# Patient Record
Sex: Male | Born: 1985
Health system: Southern US, Community
[De-identification: ages and names within clinical notes are randomized; demographics above are authoritative.]

## PROBLEM LIST (undated history)

## (undated) DIAGNOSIS — N200 Calculus of kidney: Secondary | ICD-10-CM

## (undated) DIAGNOSIS — T7840XA Allergy, unspecified, initial encounter: Secondary | ICD-10-CM

## (undated) DIAGNOSIS — F909 Attention-deficit hyperactivity disorder, unspecified type: Secondary | ICD-10-CM

## (undated) HISTORY — PX: DENTAL SURGERY: SHX609

## (undated) HISTORY — DX: Allergy, unspecified, initial encounter: T78.40XA

## (undated) HISTORY — DX: Attention-deficit hyperactivity disorder, unspecified type: F90.9

---

## 2004-11-15 ENCOUNTER — Encounter: Payer: Self-pay | Admitting: Internal Medicine

## 2006-12-29 ENCOUNTER — Ambulatory Visit: Payer: Self-pay | Admitting: Internal Medicine

## 2006-12-29 DIAGNOSIS — F909 Attention-deficit hyperactivity disorder, unspecified type: Secondary | ICD-10-CM

## 2007-01-31 ENCOUNTER — Telehealth: Payer: Self-pay | Admitting: Family Medicine

## 2007-02-26 ENCOUNTER — Ambulatory Visit: Payer: Self-pay | Admitting: Internal Medicine

## 2007-02-26 DIAGNOSIS — J069 Acute upper respiratory infection, unspecified: Secondary | ICD-10-CM | POA: Insufficient documentation

## 2007-03-14 ENCOUNTER — Telehealth (INDEPENDENT_AMBULATORY_CARE_PROVIDER_SITE_OTHER): Payer: Self-pay | Admitting: *Deleted

## 2007-04-24 ENCOUNTER — Telehealth (INDEPENDENT_AMBULATORY_CARE_PROVIDER_SITE_OTHER): Payer: Self-pay | Admitting: *Deleted

## 2007-05-29 ENCOUNTER — Telehealth (INDEPENDENT_AMBULATORY_CARE_PROVIDER_SITE_OTHER): Payer: Self-pay | Admitting: *Deleted

## 2007-06-25 ENCOUNTER — Ambulatory Visit: Payer: Self-pay | Admitting: Internal Medicine

## 2007-08-02 ENCOUNTER — Telehealth (INDEPENDENT_AMBULATORY_CARE_PROVIDER_SITE_OTHER): Payer: Self-pay | Admitting: *Deleted

## 2007-08-31 ENCOUNTER — Telehealth: Payer: Self-pay | Admitting: Family Medicine

## 2007-10-08 ENCOUNTER — Telehealth (INDEPENDENT_AMBULATORY_CARE_PROVIDER_SITE_OTHER): Payer: Self-pay | Admitting: *Deleted

## 2007-11-06 ENCOUNTER — Telehealth: Payer: Self-pay | Admitting: Internal Medicine

## 2007-11-30 ENCOUNTER — Ambulatory Visit: Payer: Self-pay | Admitting: Internal Medicine

## 2007-11-30 DIAGNOSIS — B356 Tinea cruris: Secondary | ICD-10-CM | POA: Insufficient documentation

## 2008-01-08 ENCOUNTER — Telehealth: Payer: Self-pay | Admitting: Internal Medicine

## 2008-02-07 ENCOUNTER — Telehealth: Payer: Self-pay | Admitting: Internal Medicine

## 2008-03-14 ENCOUNTER — Telehealth: Payer: Self-pay | Admitting: Family Medicine

## 2008-04-17 ENCOUNTER — Telehealth: Payer: Self-pay | Admitting: Internal Medicine

## 2008-05-20 ENCOUNTER — Telehealth: Payer: Self-pay | Admitting: Family Medicine

## 2008-06-02 ENCOUNTER — Ambulatory Visit: Payer: Self-pay | Admitting: Internal Medicine

## 2008-07-18 ENCOUNTER — Telehealth: Payer: Self-pay | Admitting: Internal Medicine

## 2008-08-19 ENCOUNTER — Telehealth: Payer: Self-pay | Admitting: Internal Medicine

## 2008-09-10 ENCOUNTER — Telehealth: Payer: Self-pay | Admitting: Internal Medicine

## 2008-10-16 ENCOUNTER — Telehealth: Payer: Self-pay | Admitting: Internal Medicine

## 2008-11-14 ENCOUNTER — Telehealth: Payer: Self-pay | Admitting: Internal Medicine

## 2008-12-03 ENCOUNTER — Ambulatory Visit: Payer: Self-pay | Admitting: Internal Medicine

## 2008-12-15 ENCOUNTER — Telehealth: Payer: Self-pay | Admitting: Family Medicine

## 2009-01-20 ENCOUNTER — Telehealth: Payer: Self-pay | Admitting: Internal Medicine

## 2009-02-23 ENCOUNTER — Telehealth: Payer: Self-pay | Admitting: Internal Medicine

## 2009-03-24 ENCOUNTER — Telehealth: Payer: Self-pay | Admitting: Internal Medicine

## 2009-05-12 ENCOUNTER — Telehealth (INDEPENDENT_AMBULATORY_CARE_PROVIDER_SITE_OTHER): Payer: Self-pay | Admitting: *Deleted

## 2009-05-27 ENCOUNTER — Ambulatory Visit: Payer: Self-pay | Admitting: Internal Medicine

## 2009-05-28 ENCOUNTER — Telehealth: Payer: Self-pay | Admitting: Family Medicine

## 2009-07-23 ENCOUNTER — Telehealth: Payer: Self-pay | Admitting: Internal Medicine

## 2009-08-26 ENCOUNTER — Telehealth: Payer: Self-pay | Admitting: Internal Medicine

## 2009-10-09 ENCOUNTER — Telehealth: Payer: Self-pay | Admitting: Family Medicine

## 2009-11-18 ENCOUNTER — Telehealth: Payer: Self-pay | Admitting: Internal Medicine

## 2009-11-23 ENCOUNTER — Telehealth: Payer: Self-pay | Admitting: Internal Medicine

## 2009-11-26 ENCOUNTER — Ambulatory Visit: Payer: Self-pay | Admitting: Internal Medicine

## 2009-11-26 DIAGNOSIS — J309 Allergic rhinitis, unspecified: Secondary | ICD-10-CM | POA: Insufficient documentation

## 2009-12-15 ENCOUNTER — Telehealth: Payer: Self-pay | Admitting: Internal Medicine

## 2010-02-22 ENCOUNTER — Telehealth: Payer: Self-pay | Admitting: Internal Medicine

## 2010-04-02 ENCOUNTER — Telehealth: Payer: Self-pay | Admitting: Internal Medicine

## 2010-04-07 NOTE — Progress Notes (Signed)
Summary: regarding concerta  Phone Note Call from Patient   Caller: Mom Summary of Call: Mother states pt has been on the same dose of concerta for years.  She is concerned about his behavior- he procrastinates, has other adult ADD problems.  He has appt to see you on 9/22.  Mother said she will tell pt that she called and that he needs to discuss these issues with you. Initial call taken by: Lowella Petties CMA,  November 23, 2009 4:16 PM  Follow-up for Phone Call        okay Unless he gives permission, I really can't discuss things with her Follow-up by: Cindee Salt MD,  November 23, 2009 5:19 PM

## 2010-04-07 NOTE — Progress Notes (Signed)
Summary: Rx Concerta  Phone Note Refill Request Call back at Home Phone [phone] Message from:  Patient on March 24, 2009 2:33 PM  Refills Requested: Medication #1:  CONCERTA 54 MG  TBCR 1 daily as directed. Patient called to request Rx refill, please call when ready for pick up.   Method Requested: Pick up at Office Initial call taken by: Linde Gillis CMA Duncan Dull),  March 24, 2009 2:33 PM  Follow-up for Phone Call        Rx written Follow-up by: Cindee Salt MD,  March 24, 2009 2:51 PM  Additional Follow-up for Phone Call Additional follow up Details #1::        Spoke with patient and advised rx ready for pick-up  Additional Follow-up by: Mervin Hack CMA Duncan Dull),  March 24, 2009 3:40 PM    Prescriptions: CONCERTA 54 MG  TBCR (METHYLPHENIDATE HCL) 1 daily as directed  #30 x 0   Entered and Authorized by:   Cindee Salt MD   Signed by:   Cindee Salt MD on 03/24/2009   Method used:   Print then Give to Patient   RxID:   [id number]

## 2010-04-07 NOTE — Progress Notes (Signed)
Summary: refill request for concerta- wants script today  Phone Note Refill Request Call back at Home Phone [phone] Message from:  Patient  Refills Requested: Medication #1:  CONCERTA 54 MG  TBCR 1 daily as directed. Please call pt when ready, he is asking to get this today.  Initial call taken by: Lowella Petties CMA,  October 09, 2009 3:35 PM  Follow-up for Phone Call        Gave prescription to Cynthia/Donna for pt pick up today if possible.  Follow-up by: Kerby Nora MD,  October 09, 2009 5:02 PM    Prescriptions: CONCERTA 54 MG  TBCR (METHYLPHENIDATE HCL) 1 daily as directed.  #30 x 0   Entered and Authorized by:   Kerby Nora MD   Signed by:   Kerby Nora MD on 10/09/2009   Method used:   Print then Give to Patient   RxID:   [id number]

## 2010-04-07 NOTE — Progress Notes (Signed)
Summary: Concerta  Phone Note Refill Request Call back at Home Phone [phone] Message from:  Patient on August 26, 2009 11:01 AM  Refills Requested: Medication #1:  CONCERTA 54 MG  TBCR 1 daily as directed. Please call patient when prescription is ready for pickup.    Method Requested: Pick up at Office Initial call taken by: Delilah Shan CMA Duncan Dull),  August 26, 2009 11:01 AM  Follow-up for Phone Call        Rx written Follow-up by: Cindee Salt MD,  August 26, 2009 12:32 PM  Additional Follow-up for Phone Call Additional follow up Details #1::        Spoke with patient and advised rx ready for pick-up  Additional Follow-up by: Mervin Hack CMA Duncan Dull),  August 26, 2009 12:58 PM    Prescriptions: CONCERTA 54 MG  TBCR (METHYLPHENIDATE HCL) 1 daily as directed.  #30 x 0   Entered and Authorized by:   Cindee Salt MD   Signed by:   Cindee Salt MD on 08/26/2009   Method used:   Print then Give to Patient   RxID:   [id number]

## 2010-04-07 NOTE — Progress Notes (Signed)
Summary: vomiting  Phone Note Call from Patient   Caller: Mom- Marcelino Duster [phone] Summary of Call: Pt has had vomiting since last night, had diarrhea but that has stopped.  No known fever.  Mom is asking if phenergan suppositories can be called to [facility].  Advised small sips of clear fluids, rest. Initial call taken by: Lowella Petties CMA,  May 28, 2009 12:14 PM  Follow-up for Phone Call        Yes. Follow-up by: Ruthe Mannan MD,  May 28, 2009 12:17 PM    New/Updated Medications: PROMETHAZINE HCL 12.5 MG  SUPP (PROMETHAZINE HCL) 1 suppository per rectum every six hours as needed nausea. Prescriptions: PROMETHAZINE HCL 12.5 MG  SUPP (PROMETHAZINE HCL) 1 suppository per rectum every six hours as needed nausea.  #20 x 0   Entered and Authorized by:   Ruthe Mannan MD   Signed by:   Ruthe Mannan MD on 05/28/2009   Method used:   Electronically to        [id number] (ret[facility])       [address]       [place], [place]  [address]       Ph: [phone]       Fax: [phone]   RxID:   [id number]   Appended Document: vomiting Wife advised.

## 2010-04-07 NOTE — Progress Notes (Signed)
Summary: refill request for concerta  Phone Note Refill Request Call back at [phone] Message from:  mother  Refills Requested: Medication #1:  CONCERTA 54 MG  TBCR 1 daily as directed. Please call mother when ready.  Initial call taken by: Lowella Petties CMA,  November 18, 2009 10:47 AM  Follow-up for Phone Call        Rx written Follow-up by: Cindee Salt MD,  November 18, 2009 2:12 PM  Additional Follow-up for Phone Call Additional follow up Details #1::        spoke with parent and advised results.  Additional Follow-up by: Mervin Hack CMA Duncan Dull),  November 18, 2009 2:58 PM    New/Updated Medications: CONCERTA 54 MG  TBCR (METHYLPHENIDATE HCL) 1 daily as directed. Prescriptions: CONCERTA 54 MG  TBCR (METHYLPHENIDATE HCL) 1 daily as directed.  #30 x 0   Entered and Authorized by:   Cindee Salt MD   Signed by:   Cindee Salt MD on 11/18/2009   Method used:   Print then Give to Patient   RxID:   [id number]

## 2010-04-07 NOTE — Progress Notes (Signed)
Summary: concerta  Phone Note Refill Request Call back at Home Phone [phone]   Refills Requested: Medication #1:  CONCERTA 54 MG  TBCR 1 daily as directed.  Method Requested: Pick up at Office Initial call taken by: Melody Comas,  Jul 23, 2009 2:21 PM  Follow-up for Phone Call        Rx written Follow-up by: Cindee Salt MD,  Jul 23, 2009 3:47 PM  Additional Follow-up for Phone Call Additional follow up Details #1::        Spoke with patient and advised rx ready for pick-up  Additional Follow-up by: Mervin Hack CMA Duncan Dull),  Jul 23, 2009 4:15 PM    Prescriptions: CONCERTA 54 MG  TBCR (METHYLPHENIDATE HCL) 1 daily as directed.  #30 x 0   Entered and Authorized by:   Cindee Salt MD   Signed by:   Cindee Salt MD on 07/23/2009   Method used:   Print then Give to Patient   RxID:   [id number]

## 2010-04-07 NOTE — Assessment & Plan Note (Signed)
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  Past Medical History: ADHD--inattentive Allergic rhinitis--spring mostly  Past Surgical History: Reviewed history from 12/29/2006 and no changes required. root canals and capping--toddler then  Implants 2006  Family History: Reviewed history from 12/29/2006 and no changes required. 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 Eyes:  Denies double vision and vision loss-1 eye. ENT:  Denies decreased hearing and ringing in ears; teeth okay--sees dentist. CV:  Complains of chest pain or discomfort; denies difficulty breathing at night, difficulty breathing while lying down, fainting, lightheadness, palpitations, and shortness of breath  with exertion; woke with sensation that he couldn't take a full breath about 1 month ago. Tight over lower sternum. Drank some water and moved bowels--got better with drinking. Marland Kitchen Resp:  Denies cough and shortness of breath. GI:  Complains of indigestion; denies abdominal pain, bloody stools, change in bowel habits, and dark tarry stools; heartburn in the past--none recently. GU:  Denies erectile dysfunction; discussed safe sex HIV negative about 1 year ago. MS:  Denies joint pain and joint swelling. Derm:  Denies lesion(s) and rash. Neuro:  Denies numbness, tingling, and weakness. Psych:  Complains of depression; denies anxiety; brief depression when he lost job. Heme:  Denies abnormal bruising and enlarge lymph nodes. Allergy:  Complains of seasonal allergies and sneezing; uses OTC as needed .  Physical Exam  General:  alert and normal appearance.   Eyes:  pupils equal, pupils round, pupils reactive to light, and no optic disk abnormalities.   Ears:  R ear normal and L ear normal.   Mouth:  no erythema, no exudates, and no lesions.   Neck:  supple, no masses, no thyromegaly, no carotid bruits, and no cervical lymphadenopathy.   Lungs:  normal respiratory effort, no intercostal retractions, no accessory muscle use, and normal breath sounds.   Heart:  normal rate, regular rhythm, no murmur, and no gallop.   Abdomen:  soft, non-tender, and no masses.   Genitalia:  no scrotal masses, no testicular masses or atrophy, no cutaneous lesions, and no urethral discharge.   Msk:  no joint tenderness and no  joint swelling.   Pulses:  normal in feet Extremities:  no edema Neurologic:  alert & oriented X3, strength normal in all extremities, and gait normal.   Skin:  no rashes and no suspicious lesions.   Axillary Nodes:  No palpable lymphadenopathy Psych:  normally interactive, good eye contact, not anxious appearing, and not depressed appearing.     Impression & Recommendations:  Problem # 1:   PREVENTIVE HEALTH CARE (ICD-V70.0) Assessment Comment Only healthy discussed fitness, safe sex, etc  Problem # 2:  ATTENTION DEFICIT DISORDER, INATTENTIVE TYPE (ICD-314.00) Assessment: Deteriorated has been having worse problems and wearing off in afternoon will try increasing to 72mg  daily  Problem # 3:  ALLERGIC RHINITIS (ICD-477.9) Assessment: Comment Only discussed OTC meds asked about testing--doesn't seem necessary now  Complete Medication List: 1)  Methylphenidate Hcl 36 Mg Cr-tabs (Methylphenidate hcl) .... 2 tabs daily for inattention  Patient Instructions: 1)  Please schedule a follow-up appointment in 6 months .  Prescriptions: METHYLPHENIDATE HCL 36 MG CR-TABS (METHYLPHENIDATE HCL) 2 tabs daily for inattention  #60 x 0   Entered and Authorized by:   Cindee Salt MD   Signed by:   Cindee Salt MD on 11/26/2009   Method used:   Print then Give to Patient   RxID:   [id number]   Current Allergies (reviewed today): No known allergies

## 2010-04-07 NOTE — Progress Notes (Signed)
Summary: regarding concerta  Phone Note Call from Patient Call back at Home Phone [phone]   Caller: Patient Summary of Call: Pt is asking if he can have another script for concerta, he wants to take 36 mg in the morning and 18 mg around lunchtime, or if there is another medicine that he can try.  He thinks the two 30 mg's that he takes in the morning is too much.  It has helped with his attention span but making him too aggressive. Initial call taken by: Lowella Petties CMA,  December 15, 2009 3:50 PM  Follow-up for Phone Call        Okay to change the 36mg  to once daily and then add 18mg  on days he wants more  If this works out, we can just go back to 54mg  tabs after these run out Follow-up by: Cindee Salt MD,  December 16, 2009 2:12 PM  Additional Follow-up for Phone Call Additional follow up Details #1::        Spoke with patient and advised rx ready for pick-up and advised new instructions. Additional Follow-up by: Mervin Hack CMA Duncan Dull),  December 17, 2009 1:54 PM    New/Updated Medications: METHYLPHENIDATE HCL 36 MG CR-TABS (METHYLPHENIDATE HCL) 1 tabs daily for inattention METHYLPHENIDATE HCL 18 MG CR-TABS (METHYLPHENIDATE HCL) 1 tab daily as directed Prescriptions: METHYLPHENIDATE HCL 18 MG CR-TABS (METHYLPHENIDATE HCL) 1 tab daily as directed  #30 x 0   Entered and Authorized by:   Cindee Salt MD   Signed by:   Cindee Salt MD on 12/16/2009   Method used:   Print then Give to Patient   RxID:   [id number]

## 2010-04-07 NOTE — Assessment & Plan Note (Signed)
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  Past Medical History: Reviewed history from 06/25/2007 and no changes required. ADHD--inattentive  Past Surgical History: Reviewed history from 12/29/2006 and no changes required. root canals and capping--toddler then  Implants 2006  Family History: Reviewed history from 12/29/2006 and no changes required. Parents in good health 1 younger sister Mat GM with CAD HTN on Mom's side DM in mat GF Cancer on mom's side--not sure what type  Social History: Reviewed history from 06/25/2007 and no changes required. Occupation: Licensed conveyancer Part time at [employer] Single Never Smoked Alcohol use--rare No exercise lately  Review of Systems       weight fairly stable tries to exercise when he can--weekly basketball, softball No mood problems---no sig anxiety or depression  Physical Exam  General:  alert and normal appearance.   Psych:  normally interactive, good eye contact, not anxious appearing, and not depressed appearing.     Impression & Recommendations:  Problem # 1:  ATTENTION DEFICIT DISORDER, INATTENTIVE TYPE (ICD-314.00) Assessment  Unchanged doing well no problems with the medicine no dosage changes needed no sig side effects  Complete Medication List: 1)  Concerta 54 Mg Tbcr (Methylphenidate hcl) .Marland Kitchen.. 1 daily as directed.  Patient Instructions: 1)  Please schedule a follow-up appointment in 6 months for physical Prescriptions: CONCERTA 54 MG  TBCR (METHYLPHENIDATE HCL) 1 daily as directed. Please fill in about 2 weeks  #30 x 0   Entered and Authorized by:   Cindee Salt MD   Signed by:   Cindee Salt MD on 05/27/2009   Method used:   Print then Give to Patient   RxID:   [id number]   Current Allergies (reviewed today): No known allergies

## 2010-04-07 NOTE — Progress Notes (Signed)
Summary: refill request for concerta  Phone Note Refill Request Call back at Home Phone [phone] Message from:  Patient  Refills Requested: Medication #1:  CONCERTA 54 MG  TBCR 1 daily as directed. Please call pt when ready.  Initial call taken by: Lowella Petties CMA,  May 12, 2009 12:04 PM  Follow-up for Phone Call        Patient advised.Consuello Masse CMA  Follow-up by: Benny Lennert CMA Duncan Dull),  May 12, 2009 3:02 PM    Prescriptions: CONCERTA 54 MG  TBCR (METHYLPHENIDATE HCL) 1 daily as directed  #30 x 0   Entered and Authorized by:   Hannah Beat MD   Signed by:   Hannah Beat MD on 05/12/2009   Method used:   Print then Give to Patient   RxID:   [id number]

## 2010-04-08 NOTE — Progress Notes (Signed)
Summary: methylphenidate   Phone Note Refill Request Call back at Home Phone [phone] Message from:  Patient on February 22, 2010 10:58 AM  Refills Requested: Medication #1:  METHYLPHENIDATE HCL 36 MG CR-TABS 1 tabs daily for inattention  Medication #2:  METHYLPHENIDATE HCL 18 MG CR-TABS 1 tab daily as directed.  Patient is asking for 54 mg tabs.    Method Requested: Pick up at Office Initial call taken by: Melody Comas,  February 22, 2010 10:59 AM  Follow-up for Phone Call        Rx written Follow-up by: Cindee Salt MD,  February 22, 2010 11:44 AM  Additional Follow-up for Phone Call Additional follow up Details #1::        Patient notified that rx is up front and ready for pickup. Additional Follow-up by: Sydell Axon LPN,  February 22, 2010 12:09 PM    New/Updated Medications: METHYLPHENIDATE HCL 54 MG CR-TABS (METHYLPHENIDATE HCL) 1 tab daily as directed for inattention Prescriptions: METHYLPHENIDATE HCL 54 MG CR-TABS (METHYLPHENIDATE HCL) 1 tab daily as directed for inattention  #30 x 0   Entered and Authorized by:   Cindee Salt MD   Signed by:   Cindee Salt MD on 02/22/2010   Method used:   Print then Give to Patient   RxID:   [id number]

## 2010-04-08 NOTE — Progress Notes (Signed)
Summary: concerta  Phone Note Refill Request Call back at Home Phone [phone] Message from:  Fax from Pharmacy on April 02, 2010 2:05 PM  Refills Requested: Medication #1:  METHYLPHENIDATE HCL 54 MG CR-TABS 1 tab daily as directed for inattention.  Method Requested: Pick up at Office Initial call taken by: Melody Comas,  April 02, 2010 2:05 PM  Follow-up for Phone Call        Rx written Follow-up by: Cindee Salt MD,  April 02, 2010 4:03 PM  Additional Follow-up for Phone Call Additional follow up Details #1::        Spoke with patient and advised rx ready for pick-up  Additional Follow-up by: Mervin Hack CMA Duncan Dull),  April 02, 2010 4:23 PM    Prescriptions: METHYLPHENIDATE HCL 54 MG CR-TABS (METHYLPHENIDATE HCL) 1 tab daily as directed for inattention  #30 x 0   Entered and Authorized by:   Cindee Salt MD   Signed by:   Cindee Salt MD on 04/02/2010   Method used:   Print then Give to Patient   RxID:   [id number]

## 2010-04-14 ENCOUNTER — Encounter: Payer: Self-pay | Admitting: Internal Medicine

## 2010-05-11 ENCOUNTER — Telehealth: Payer: Self-pay | Admitting: Family Medicine

## 2010-05-18 NOTE — Progress Notes (Signed)
Summary: refill request for concerta  Phone Note Refill Request Call back at Home Phone [phone] Message from:  Patient  Refills Requested: Medication #1:  METHYLPHENIDATE HCL 54 MG CR-TABS 1 tab daily as directed for inattention. Pt is requesting 72 mg's, which he was to have increased to after his physical in september.  He says he had some 72 mg's but ran out and since then we have been giving him 54 mg scripts.  Please call when ready.  Initial call taken by: Lowella Petties CMA, AAMA,  May 11, 2010 3:54 PM  Follow-up for Phone Call        I see where that change was made in note  printed in put in nurse in box for pickup  Follow-up by: Judith Part MD,  May 11, 2010 4:45 PM  Additional Follow-up for Phone Call Additional follow up Details #1::        Left message for patient to call back. Prescription left at front desk. Lewanda Rife LPN  May 11, 4130 4:58 PM   Pt did not call back but Zella Ball at front desk said pt picked up rx.Lewanda Rife LPN  May 12, 4399 3:14 PM     New/Updated Medications: METHYLPHENIDATE HCL 36 MG CR-TABS (METHYLPHENIDATE HCL) 2 by mouth once daily for inattention Prescriptions: METHYLPHENIDATE HCL 36 MG CR-TABS (METHYLPHENIDATE HCL) 2 by mouth once daily for inattention  #60 x 0   Entered and Authorized by:   Judith Part MD   Signed by:   Judith Part MD on 05/11/2010   Method used:   Print then Give to Patient   RxID:   [id number]

## 2010-06-01 ENCOUNTER — Ambulatory Visit: Payer: Self-pay | Admitting: Internal Medicine

## 2010-06-15 ENCOUNTER — Other Ambulatory Visit: Payer: Self-pay | Admitting: *Deleted

## 2010-06-15 NOTE — Telephone Encounter (Signed)
Patient is requesting a written Rx for Concerta.  Please advise.

## 2010-06-16 MED ORDER — METHYLPHENIDATE HCL ER (OSM) 36 MG PO TBCR: EXTENDED_RELEASE_TABLET | ORAL | Status: DC

## 2010-06-16 NOTE — Telephone Encounter (Signed)
Left message on machine that rx ready for pick-up 

## 2010-06-18 ENCOUNTER — Encounter: Payer: Self-pay | Admitting: Internal Medicine

## 2010-06-18 ENCOUNTER — Ambulatory Visit (INDEPENDENT_AMBULATORY_CARE_PROVIDER_SITE_OTHER): Payer: Self-pay | Admitting: Internal Medicine

## 2010-06-18 VITALS — BP 137/74 | HR 100 | Temp 98.2°F | Ht 71.0 in | Wt 193.0 lb

## 2010-06-18 DIAGNOSIS — F988 Other specified behavioral and emotional disorders with onset usually occurring in childhood and adolescence: Secondary | ICD-10-CM

## 2010-06-18 NOTE — Progress Notes (Signed)
  Subjective:    Patient ID: Billy Patterson, male    DOB: 03-18-1985, 25 y.o.   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 Allergy   . ADHD (attention deficit hyperactivity disorder)     No past surgical history on file.  Family History  Problem Relation Age of Onset  . Heart disease Maternal Grandmother     cad  . Diabetes Maternal Grandfather     History   Social History  . Marital Status: Single    Spouse Name: N/A    Number of Children: N/A  . Years of Education: N/A   Occupational History  . Not on file.   Social History Main Topics  . Smoking status: Never Smoker   . Smokeless tobacco: Not on file  . Alcohol Use: Yes  . Drug Use: No  . Sexually Active:    Other Topics Concern  . Not on file   Social History Narrative  . No narrative on file   Review of Systems Appetite is okay Weight fluctuates some Uses med on Sunday for church--only occ skips a day Sleeps okay--some trouble lately due to sinuses--is taking allegra and zyrtec     Objective:   Physical Exam  Constitutional: He appears well-developed and well-nourished. No distress.  Psychiatric: He has a normal mood and affect. His behavior is normal. Judgment and thought content normal.          Assessment & Plan:

## 2010-07-22 ENCOUNTER — Other Ambulatory Visit: Payer: Self-pay | Admitting: *Deleted

## 2010-07-22 MED ORDER — METHYLPHENIDATE HCL ER (OSM) 36 MG PO TBCR: EXTENDED_RELEASE_TABLET | ORAL | Status: DC

## 2010-07-22 NOTE — Telephone Encounter (Signed)
Spoke with patient and advised results   

## 2010-08-27 ENCOUNTER — Other Ambulatory Visit: Payer: Self-pay | Admitting: *Deleted

## 2010-08-27 MED ORDER — METHYLPHENIDATE HCL ER (OSM) 36 MG PO TBCR: EXTENDED_RELEASE_TABLET | ORAL | Status: DC

## 2010-08-27 NOTE — Telephone Encounter (Signed)
Spoke with patient and advised results   

## 2010-08-27 NOTE — Telephone Encounter (Signed)
Please call pt when ready.

## 2010-10-05 ENCOUNTER — Other Ambulatory Visit: Payer: Self-pay | Admitting: *Deleted

## 2010-10-05 MED ORDER — METHYLPHENIDATE HCL ER (OSM) 36 MG PO TBCR: EXTENDED_RELEASE_TABLET | ORAL | Status: DC

## 2010-10-05 NOTE — Telephone Encounter (Signed)
Patient called and requested a refill on his Concerta. Please let patient know when it is ready for pickup.

## 2010-10-05 NOTE — Telephone Encounter (Signed)
Patient advised Rx ready for pick up will be left at front desk. 

## 2010-11-10 ENCOUNTER — Other Ambulatory Visit: Payer: Self-pay | Admitting: *Deleted

## 2010-11-10 MED ORDER — METHYLPHENIDATE HCL ER (OSM) 36 MG PO TBCR: EXTENDED_RELEASE_TABLET | ORAL | Status: DC

## 2010-11-10 NOTE — Telephone Encounter (Signed)
Left message on machine that rx is ready for pick-up, and it will be at our front desk.  

## 2010-11-10 NOTE — Telephone Encounter (Signed)
Please call pt when ready.

## 2010-12-16 ENCOUNTER — Other Ambulatory Visit: Payer: Self-pay | Admitting: *Deleted

## 2010-12-16 MED ORDER — METHYLPHENIDATE HCL ER (OSM) 36 MG PO TBCR: EXTENDED_RELEASE_TABLET | ORAL | Status: DC

## 2010-12-16 NOTE — Telephone Encounter (Signed)
Please call pt when ready.

## 2010-12-17 ENCOUNTER — Telehealth: Payer: Self-pay | Admitting: Internal Medicine

## 2010-12-17 NOTE — Telephone Encounter (Signed)
Spoke with patient and informed him that prescription is ready for him to pick up at the office.

## 2010-12-17 NOTE — Telephone Encounter (Signed)
Jamesetta So spoke with patient and advised results

## 2010-12-24 ENCOUNTER — Ambulatory Visit (INDEPENDENT_AMBULATORY_CARE_PROVIDER_SITE_OTHER): Payer: Self-pay | Admitting: Internal Medicine

## 2010-12-24 DIAGNOSIS — Z0289 Encounter for other administrative examinations: Secondary | ICD-10-CM

## 2010-12-24 DIAGNOSIS — F988 Other specified behavioral and emotional disorders with onset usually occurring in childhood and adolescence: Secondary | ICD-10-CM

## 2010-12-25 NOTE — Progress Notes (Signed)
  Subjective:    Patient ID: Billy Patterson, male    DOB: April 19, 1985, 25 y.o.   MRN: [id number]  HPI No show for appointment   Review of Systems     Objective:   Physical Exam        Assessment & Plan:

## 2011-01-20 ENCOUNTER — Other Ambulatory Visit: Payer: Self-pay

## 2011-01-20 NOTE — Telephone Encounter (Signed)
Pt request written rx for Concerta 36 mg. Please call pt at [phone] when rx ready for pick up.

## 2011-01-21 MED ORDER — METHYLPHENIDATE HCL ER (OSM) 36 MG PO TBCR: EXTENDED_RELEASE_TABLET | ORAL | Status: DC

## 2011-01-21 NOTE — Telephone Encounter (Signed)
Spoke with patient and advised results Also spoke with patient about making appt, he will schedule when he picks up rx

## 2011-01-21 NOTE — Telephone Encounter (Signed)
He missed his last appt Make sure he reschedules before his next request Due now

## 2011-01-24 ENCOUNTER — Encounter: Payer: Self-pay | Admitting: Internal Medicine

## 2011-01-24 ENCOUNTER — Ambulatory Visit (INDEPENDENT_AMBULATORY_CARE_PROVIDER_SITE_OTHER): Payer: PRIVATE HEALTH INSURANCE | Admitting: Internal Medicine

## 2011-01-24 DIAGNOSIS — F988 Other specified behavioral and emotional disorders with onset usually occurring in childhood and adolescence: Secondary | ICD-10-CM

## 2011-01-24 NOTE — Progress Notes (Signed)
  Subjective:    Patient ID: Billy Patterson, male    DOB: 1985/05/01, 25 y.o.   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 methylphenidate (CONCERTA) 36 MG CR tablet Take two by mouth once daily  60 tablet  0    No Known Allergies  Past Medical History  Diagnosis Date  . Allergy   . ADHD (attention deficit hyperactivity disorder)     No past surgical history on file.  Family History  Problem Relation Age of Onset  . Heart disease Maternal Grandmother     cad  . Diabetes Maternal Grandfather     History   Social History  . Marital Status: Single    Spouse Name: N/A    Number of Children: N/A  . Years of Education: N/A   Occupational History  . Not on file.   Social History Main Topics  . Smoking status: Never Smoker   . Smokeless tobacco: Never Used  . Alcohol Use: Yes  . Drug Use: No  . Sexually Active:    Other Topics Concern  . Not on file   Social History Narrative  . No narrative on file   Review of Systems No N/V No mood problems that he has noted---generally satisfied    Objective:   Physical Exam  Constitutional: He appears well-developed and well-nourished. No distress.  Psychiatric: He has a normal mood and affect. His behavior is normal. Judgment and thought content normal.          Assessment & Plan:

## 2011-01-24 NOTE — Assessment & Plan Note (Signed)
Doing well Still needs the medicine about daily Has school and work still  Will continue

## 2011-02-22 ENCOUNTER — Other Ambulatory Visit: Payer: Self-pay | Admitting: Internal Medicine

## 2011-02-23 MED ORDER — METHYLPHENIDATE HCL ER (OSM) 36 MG PO TBCR: EXTENDED_RELEASE_TABLET | ORAL | Status: DC

## 2011-02-23 NOTE — Telephone Encounter (Signed)
Left message on machine that rx is ready for pick-up, and it will be at our front desk.  

## 2011-03-28 ENCOUNTER — Other Ambulatory Visit: Payer: Self-pay | Admitting: *Deleted

## 2011-03-28 MED ORDER — METHYLPHENIDATE HCL ER (OSM) 36 MG PO TBCR: EXTENDED_RELEASE_TABLET | ORAL | Status: DC

## 2011-03-29 NOTE — Telephone Encounter (Signed)
Left message on machine that rx is ready for pick-up, and it will be at our front desk.  

## 2011-05-03 ENCOUNTER — Other Ambulatory Visit: Payer: Self-pay | Admitting: Internal Medicine

## 2011-05-03 MED ORDER — METHYLPHENIDATE HCL ER (OSM) 36 MG PO TBCR: EXTENDED_RELEASE_TABLET | ORAL | Status: DC

## 2011-05-03 NOTE — Telephone Encounter (Signed)
Spoke with patient and advised rx ready for pick-up and it will be at the front desk.  

## 2011-05-03 NOTE — Telephone Encounter (Signed)
Triage Record Num: [id number] Operator: Jeraldine Loots Patient Name: Billy Patterson Call Date & Time: 05/03/2011 9:18:18AM Patient Phone: [phone] PCP: Tillman Abide Patient Gender: Male PCP Fax : [phone] Patient DOB: 03/03/1986 Practice Name: [facility] Reason for Call: Caller: Lenorris/Patient; PCP: Tillman Abide I.; CB#: [phone]; Needs a new script for his Concerta 72mg . ** He is completely out of the medication. ** Protocol(s) Used: Office Note Recommended Outcome per Protocol: Information Noted and Sent to Office Reason for Outcome: Caller information to office Care Advice: ~ 05/03/2011 9:21:14AM Page 1 of 1 [id number]

## 2011-06-07 ENCOUNTER — Other Ambulatory Visit: Payer: Self-pay

## 2011-06-07 MED ORDER — METHYLPHENIDATE HCL ER (OSM) 36 MG PO TBCR: EXTENDED_RELEASE_TABLET | ORAL | Status: DC

## 2011-06-07 NOTE — Telephone Encounter (Signed)
Spoke with Barnet Glasgow and advised rx ready for pick-up and it will be at the front desk.

## 2011-06-07 NOTE — Telephone Encounter (Signed)
Billy Patterson called requesting written rx for Concerta 36 mg. Pt is out of med and pt is in school and cannot be reached and please call Gurkirat Basher [phone] when rx ready for pick up. Pt  saw Dr Alphonsus Sias 01/24/11.

## 2011-06-13 ENCOUNTER — Emergency Department: Payer: Self-pay | Admitting: Emergency Medicine

## 2011-07-07 ENCOUNTER — Other Ambulatory Visit: Payer: Self-pay

## 2011-07-07 MED ORDER — METHYLPHENIDATE HCL ER (OSM) 36 MG PO TBCR: EXTENDED_RELEASE_TABLET | ORAL | Status: DC

## 2011-07-07 NOTE — Telephone Encounter (Signed)
Left message on machine that rx is ready for pick-up, and it will be at our front desk.  

## 2011-07-07 NOTE — Telephone Encounter (Signed)
Pt request written rx Concerta 36 mg. Pt last seen 01/24/11. When rx ready for pick up call [phone].

## 2011-07-28 ENCOUNTER — Encounter: Payer: PRIVATE HEALTH INSURANCE | Admitting: Internal Medicine

## 2011-07-28 DIAGNOSIS — Z0289 Encounter for other administrative examinations: Secondary | ICD-10-CM

## 2013-05-30 IMAGING — CR RIGHT HAND - COMPLETE 3+ VIEW
1 series · 3 of 3 positions shown · non-contrast
Comparison: none

REASON FOR EXAM: punched glass door, 3rd MCP abrasion with glass in joint
COMMENTS:

PROCEDURE:     DXR - DXR HAND RT COMPLETE W/OBLIQUES  - June 13, 2011  [DATE]
RESULT:     No fracture, dislocation or other acute bony abnormality is
identified. No radiodense soft tissue foreign body is seen.

[Series 1: x hand pa right · 0.14mm/px · 3 of 3 slices shown]
[im 1/3]
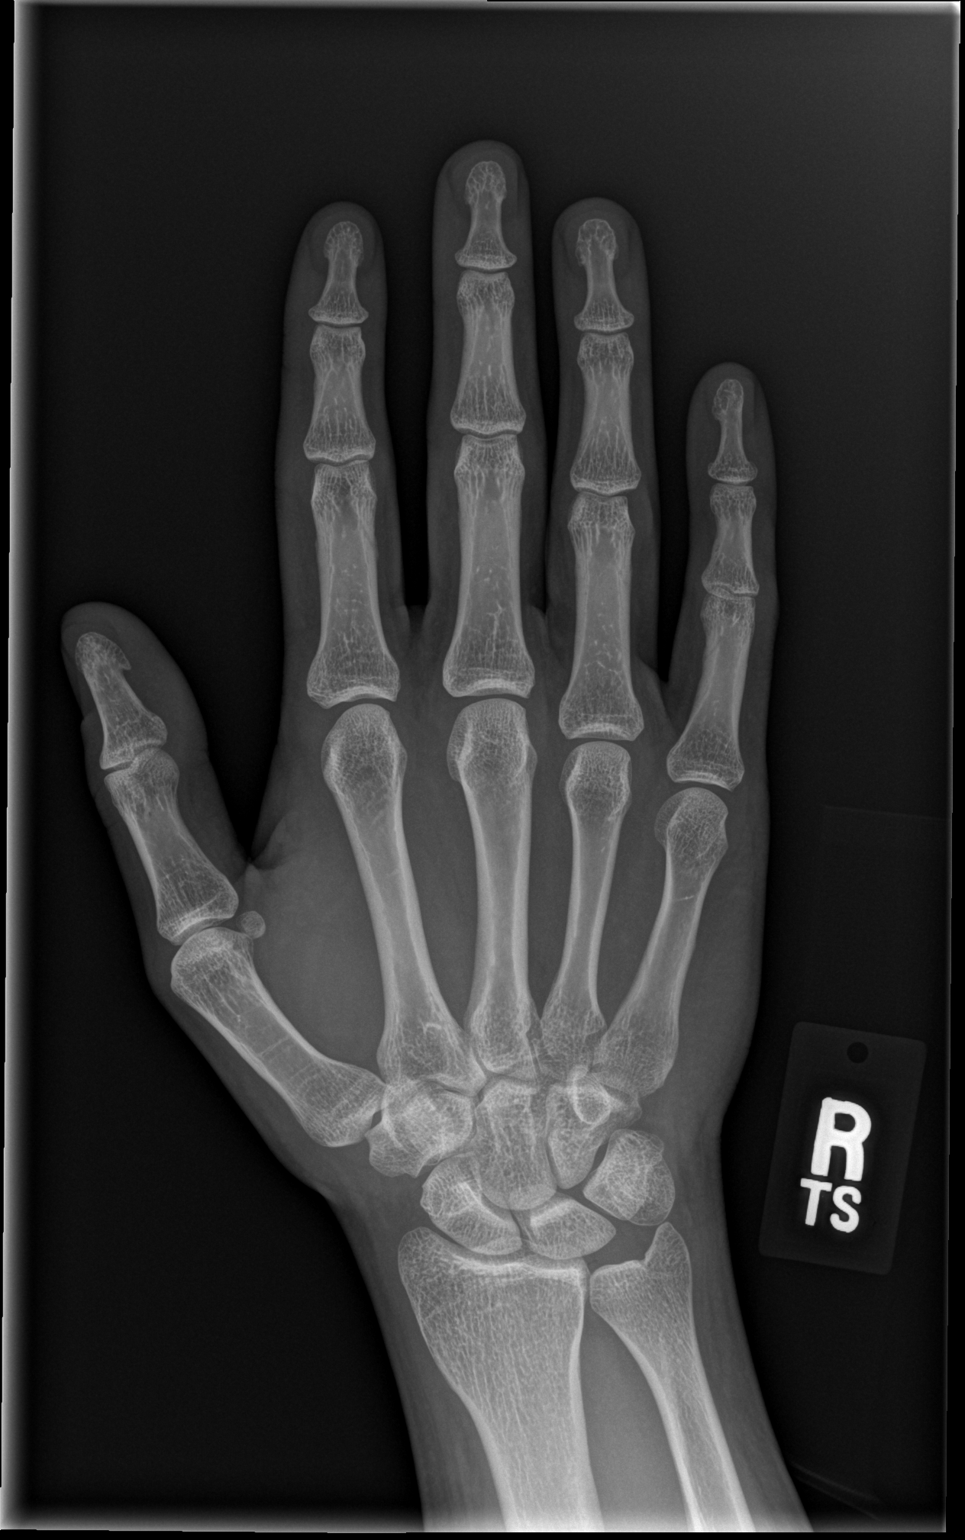
[im 2/3]
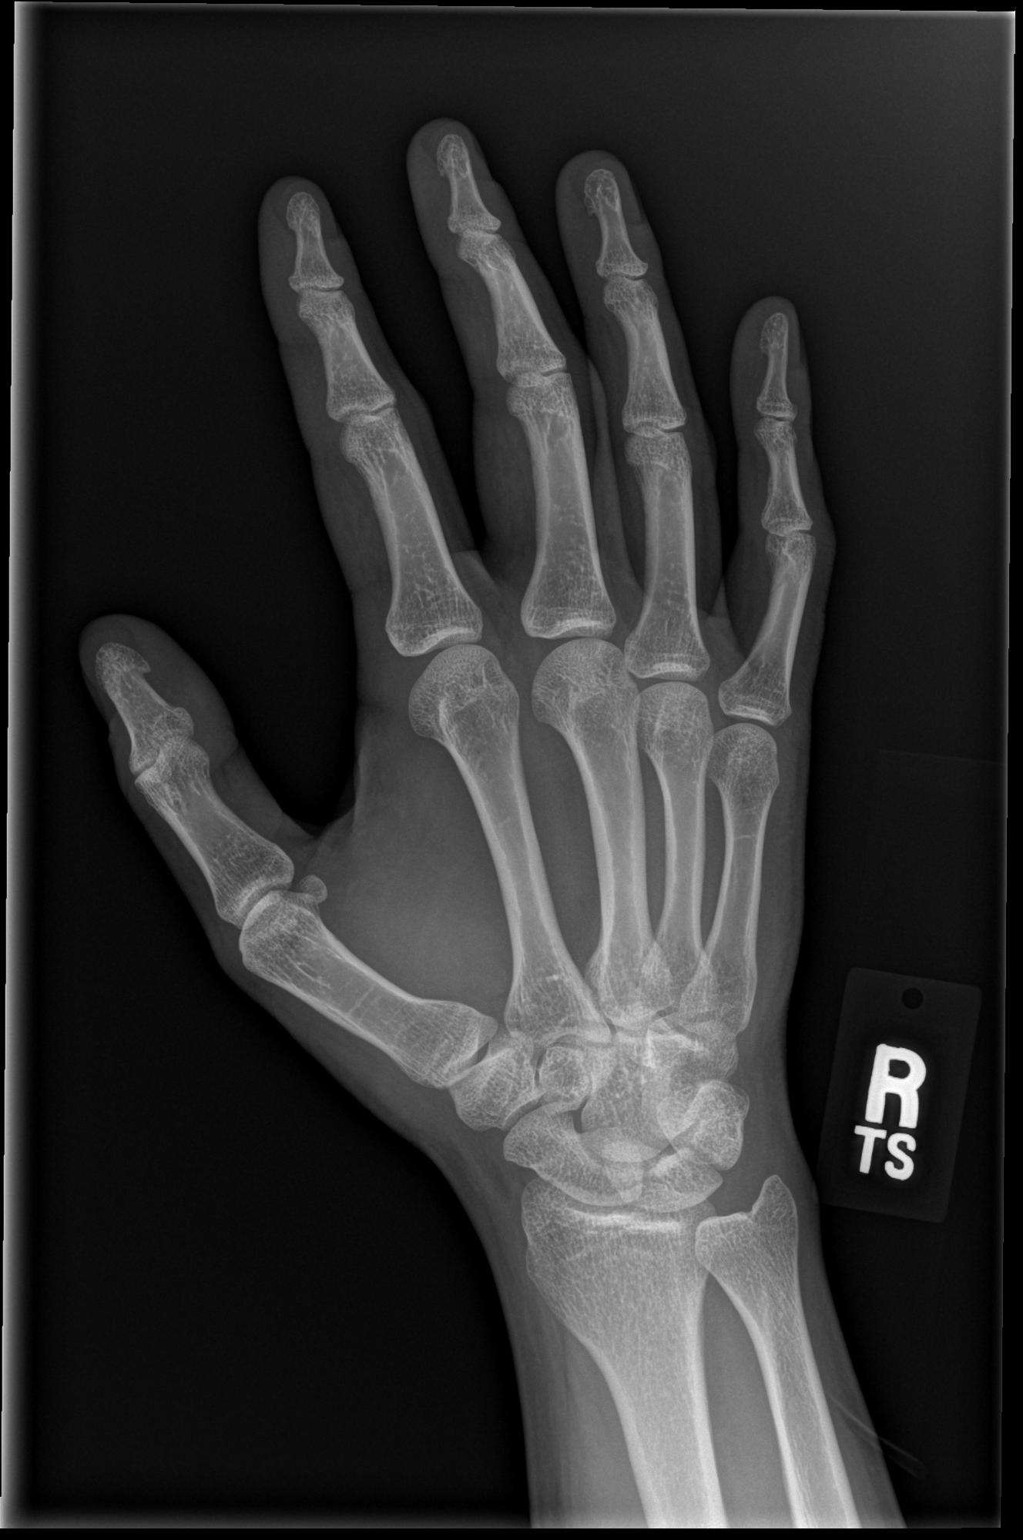
[im 3/3]
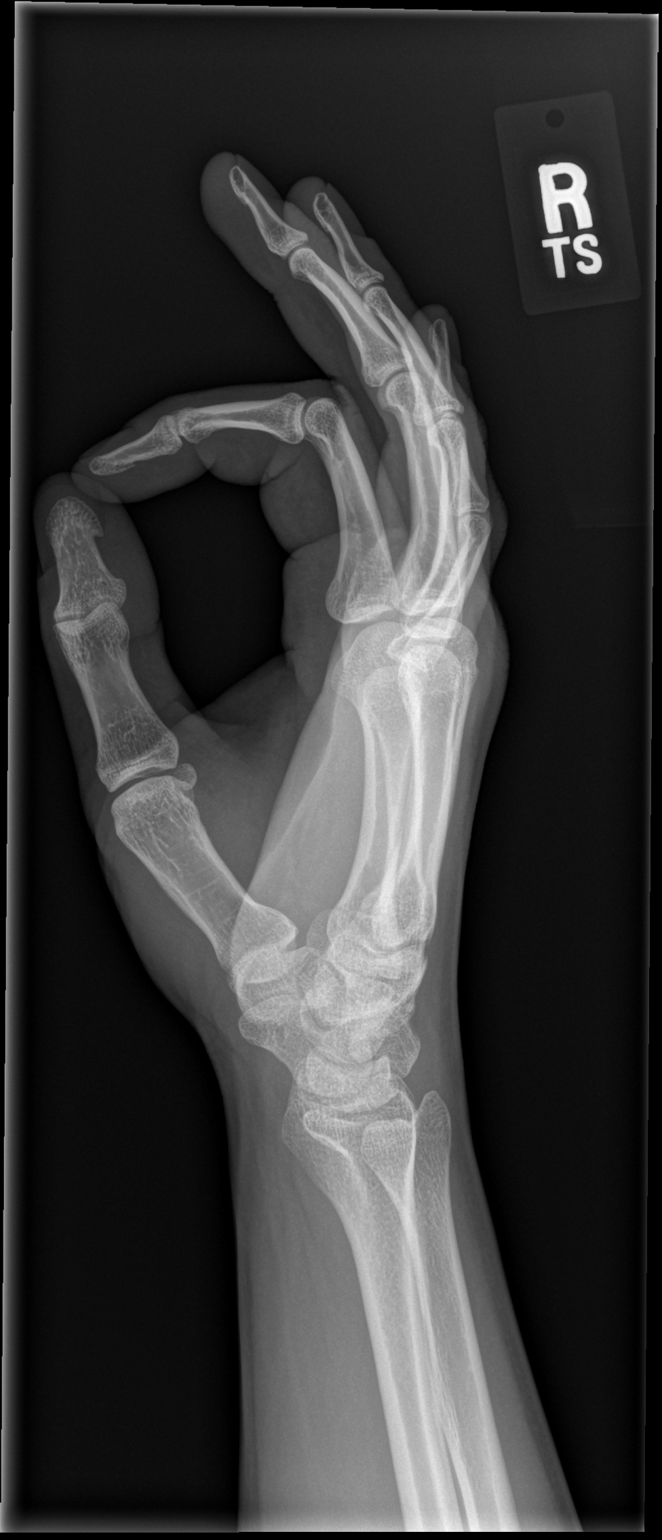

[3 of 3 positions shown; findings below may reference images not displayed]

IMPRESSION: 1.     No significant abnormalities are identified.

## 2013-08-16 ENCOUNTER — Ambulatory Visit (INDEPENDENT_AMBULATORY_CARE_PROVIDER_SITE_OTHER): Payer: BC Managed Care – PPO | Admitting: Internal Medicine

## 2013-08-16 ENCOUNTER — Encounter: Payer: Self-pay | Admitting: Internal Medicine

## 2013-08-16 VITALS — BP 132/84 | HR 104 | Temp 99.4°F | Wt 182.0 lb

## 2013-08-16 DIAGNOSIS — J309 Allergic rhinitis, unspecified: Secondary | ICD-10-CM

## 2013-08-16 NOTE — Progress Notes (Signed)
HPI  Pt presents to the clinic today with c/o headache, nasal congestion, chest congestion and cough. He reports this started 4 day ago. He describes the headache as pressure. The cough is productive of yellow mucous. He has tried Mucinex, Delsym, Aleve and Claritin with some relief. He does have a history of seasonal allergies. He has not had sick contacts that he is aware of.  Review of Systems    Past Medical History  Diagnosis Date  . Allergy   . ADHD (attention deficit hyperactivity disorder)     Family History  Problem Relation Age of Onset  . Heart disease Maternal Grandmother     cad  . Diabetes Maternal Grandfather     History   Social History  . Marital Status: Single    Spouse Name: N/A    Number of Children: N/A  . Years of Education: N/A   Occupational History  . Not on file.   Social History Main Topics  . Smoking status: Never Smoker   . Smokeless tobacco: Never Used  . Alcohol Use: Yes  . Drug Use: No  . Sexual Activity:    Other Topics Concern  . Not on file   Social History Narrative  . No narrative on file    No Known Allergies   Constitutional: Positive headache, fatigue and fever. Denies abrupt weight changes.  HEENT:  Positive eye pain, pressure behind the eyes, facial pain, nasal congestion and sore throat. Denies eye redness, ear pain, ringing in the ears, wax buildup, runny nose or bloody nose. Respiratory: Positive cough. Denies difficulty breathing or shortness of breath.  Cardiovascular: Denies chest pain, chest tightness, palpitations or swelling in the hands or feet.   No other specific complaints in a complete review of systems (except as listed in HPI above).  Objective:   BP 132/84  Pulse 104  Temp(Src) 99.4 F (37.4 C) (Oral)  Wt 182 lb (82.555 kg)  SpO2 99%  General: Appears his stated age, well developed, well nourished in NAD. HEENT: Head: normal shape and size, no sinus tenderness noted; Eyes: sclera white, no  icterus, conjunctiva pink, PERRLA and EOMs intact; Ears: Tm's gray and intact, normal light reflex; Nose: mucosa pink and moist, septum midline; Throat/Mouth: + PND. Teeth present, mucosa pink and moist, no exudate noted, no lesions or ulcerations noted.  Neck: Neck supple, trachea midline. No massses, lumps or thyromegaly present.  Cardiovascular: Tachycardic with normal rhythm. S1,S2 noted.  No murmur, rubs or gallops noted. No JVD or BLE edema. No carotid bruits noted. Pulmonary/Chest: Normal effort and positive vesicular breath sounds. No respiratory distress. No wheezes, rales or ronchi noted.      Assessment & Plan:   Allergic Rhinitis  Can use a Neti Pot which can be purchased from your local drug store. Try zyrtec and Flonase OTC Work note provided  RTC as needed or if symptoms persist.

## 2013-08-16 NOTE — Patient Instructions (Addendum)
Allergic Rhinitis Allergic rhinitis is when the mucous membranes in the nose respond to allergens. Allergens are particles in the air that cause your body to have an allergic reaction. This causes you to release allergic antibodies. Through a chain of events, these eventually cause you to release histamine into the blood stream. Although meant to protect the body, it is this release of histamine that causes your discomfort, such as frequent sneezing, congestion, and an itchy, runny nose.  CAUSES  Seasonal allergic rhinitis (hay fever) is caused by pollen allergens that may come from grasses, trees, and weeds. Year-round allergic rhinitis (perennial allergic rhinitis) is caused by allergens such as house dust mites, pet dander, and mold spores.  SYMPTOMS   Nasal stuffiness (congestion).  Itchy, runny nose with sneezing and tearing of the eyes. DIAGNOSIS  Your health care provider can help you determine the allergen or allergens that trigger your symptoms. If you and your health care provider are unable to determine the allergen, skin or blood testing may be used. TREATMENT  Allergic Rhinitis does not have a cure, but it can be controlled by:  Medicines and allergy shots (immunotherapy).  Avoiding the allergen. Hay fever may often be treated with antihistamines in pill or nasal spray forms. Antihistamines block the effects of histamine. There are over-the-counter medicines that may help with nasal congestion and swelling around the eyes. Check with your health care provider before taking or giving this medicine.  If avoiding the allergen or the medicine prescribed do not work, there are many new medicines your health care provider can prescribe. Stronger medicine may be used if initial measures are ineffective. Desensitizing injections can be used if medicine and avoidance does not work. Desensitization is when a patient is given ongoing shots until the body becomes less sensitive to the allergen.  Make sure you follow up with your health care provider if problems continue. HOME CARE INSTRUCTIONS It is not possible to completely avoid allergens, but you can reduce your symptoms by taking steps to limit your exposure to them. It helps to know exactly what you are allergic to so that you can avoid your specific triggers. SEEK MEDICAL CARE IF:   You have a fever.  You develop a cough that does not stop easily (persistent).  You have shortness of breath.  You start wheezing.  Symptoms interfere with normal daily activities. Document Released: 11/16/2000 Document Revised: 12/12/2012 Document Reviewed: 10/29/2012 Exit[employer] Patient Information 2014 [employer].  

## 2013-08-16 NOTE — Progress Notes (Signed)
Pre visit review using our clinic review tool, if applicable. No additional management support is needed unless otherwise documented below in the visit note. 

## 2013-09-12 ENCOUNTER — Encounter: Payer: BC Managed Care – PPO | Admitting: Internal Medicine

## 2013-10-15 ENCOUNTER — Encounter: Payer: Self-pay | Admitting: Internal Medicine

## 2013-12-30 ENCOUNTER — Emergency Department: Payer: Self-pay | Admitting: Student

## 2014-01-31 ENCOUNTER — Encounter: Payer: BC Managed Care – PPO | Admitting: Internal Medicine

## 2015-04-28 ENCOUNTER — Encounter: Payer: Self-pay | Admitting: Primary Care

## 2015-04-28 ENCOUNTER — Ambulatory Visit (INDEPENDENT_AMBULATORY_CARE_PROVIDER_SITE_OTHER): Payer: Managed Care, Other (non HMO) | Admitting: Primary Care

## 2015-04-28 VITALS — BP 138/84 | HR 72 | Temp 97.4°F | Ht 71.0 in | Wt 187.4 lb

## 2015-04-28 DIAGNOSIS — H6692 Otitis media, unspecified, left ear: Secondary | ICD-10-CM | POA: Diagnosis not present

## 2015-04-28 MED ORDER — AMOXICILLIN 500 MG PO CAPS: 500.0000 mg | ORAL_CAPSULE | Freq: Two times a day (BID) | ORAL | Status: DC

## 2015-04-28 NOTE — Progress Notes (Signed)
Pre visit review using our clinic review tool, if applicable. No additional management support is needed unless otherwise documented below in the visit note. 

## 2015-04-28 NOTE — Progress Notes (Signed)
   Subjective:    Patient ID: Billy Patterson, male    DOB: 01/04/1986, 30 y.o.   MRN: [id number]  HPI  Billy Patterson is a 30 year old male who presents today with a chief complaint of ear pain. He also reports nasal congestion, cough, nausea, headache, sore throat. His ear pain is located to the left ear which has become more bothersome in the last 24 hours. His symptoms began Friday night last week. He's taken Mucinex, Nyquil, Allegra without improvement. Denies sick contacts, fevers. His cough is non productive.   Review of Systems  Constitutional: Positive for fatigue. Negative for fever and chills.  HENT: Positive for congestion, ear pain and sore throat. Negative for sinus pressure.   Respiratory: Positive for cough. Negative for shortness of breath.   Cardiovascular: Negative for chest pain.       Past Medical History  Diagnosis Date  . Allergy   . ADHD (attention deficit hyperactivity disorder)     Social History   Social History  . Marital Status: Single    Spouse Name: N/A  . Number of Children: N/A  . Years of Education: N/A   Occupational History  . Not on file.   Social History Main Topics  . Smoking status: Never Smoker   . Smokeless tobacco: Never Used  . Alcohol Use: No  . Drug Use: No  . Sexual Activity: Not on file   Other Topics Concern  . Not on file   Social History Narrative    No past surgical history on file.  Family History  Problem Relation Age of Onset  . Heart disease Maternal Grandmother     cad  . Diabetes Maternal Grandfather     No Known Allergies  Current Outpatient Prescriptions on File Prior to Visit  Medication Sig Dispense Refill  . Dexmethylphenidate HCl (FOCALIN XR) 30 MG CP24 Take 1 capsule by mouth daily.     No current facility-administered medications on file prior to visit.    BP 138/84 mmHg  Pulse 72  Temp(Src) 97.4 F (36.3 C) (Oral)  Ht  (1.803 m)  Wt 187 lb 6.4 oz (85.004 kg)  BMI 26.15 kg/m2  SpO2  98%    Objective:   Physical Exam  Constitutional: He appears well-nourished.  HENT:  Right Ear: Tympanic membrane and ear canal normal.  Left Ear: Tympanic membrane is erythematous and bulging.  Nose: Right sinus exhibits no maxillary sinus tenderness and no frontal sinus tenderness. Left sinus exhibits no maxillary sinus tenderness and no frontal sinus tenderness.  Mouth/Throat: Oropharynx is clear and moist.  Cardiovascular: Normal rate and regular rhythm.   Pulmonary/Chest: He has no wheezes. He has rhonchi in the right upper field, the right lower field, the left upper field and the left lower field.          Assessment & Plan:  Acute Otitis Media:  Located to left TM with erythema and bulging. Pain present for 4 days also with cough.  Rhonchi noted throughout lung fields, mild. RX for Amoxil course provided. Ibuprofen and Flonase PRN. Return precautions provided.

## 2015-04-28 NOTE — Patient Instructions (Signed)
Start amoxicillin antibiotics. Take 1 tablet by mouth twice daily for 10 days.  You may take ibuprofen 600 mg three times daily as needed for ear pain and inflammation.  Try using Flonase (fluticasone) nasal spray. Instill 2 sprays in each nostril once daily. This will help with ear pressure.  Increase consumption of fluids and rest. Please notify me if no improvement in 3-4 days.  It was a pleasure meeting you!

## 2015-05-05 ENCOUNTER — Other Ambulatory Visit: Payer: Self-pay

## 2015-05-05 ENCOUNTER — Telehealth: Payer: Self-pay | Admitting: Primary Care

## 2015-05-05 DIAGNOSIS — H6692 Otitis media, unspecified, left ear: Secondary | ICD-10-CM

## 2015-05-05 NOTE — Telephone Encounter (Signed)
Pt left v/m; pt was seen on 04/28/15; pt has finished abx and still has earache. Pt request refill abx to  [facility]. Pt request cb.

## 2015-05-05 NOTE — Telephone Encounter (Signed)
Patient returned Chan's call. °

## 2015-05-05 NOTE — Telephone Encounter (Signed)
Message left for patient to return my call.  

## 2015-05-05 NOTE — Telephone Encounter (Signed)
Please notify Billy Patterson that he will need re-evaluation of his ear if no improvement on antibiotics, especially if he's running fevers. Please set up with PCP if possible, otherwise i'm happy to see him.  He may also try Flonase, nasal spray if he's experiencing pressure.

## 2015-05-05 NOTE — Telephone Encounter (Signed)
Called and notified patient of Kate's comments. Patient verbalized understanding. Follow up appt on 05/06/2015 with Jae Dire.

## 2015-05-06 ENCOUNTER — Ambulatory Visit (INDEPENDENT_AMBULATORY_CARE_PROVIDER_SITE_OTHER): Payer: Managed Care, Other (non HMO) | Admitting: Primary Care

## 2015-05-06 ENCOUNTER — Encounter: Payer: Self-pay | Admitting: Primary Care

## 2015-05-06 VITALS — BP 126/80 | HR 74 | Temp 97.8°F | Ht 71.0 in | Wt 187.8 lb

## 2015-05-06 DIAGNOSIS — H6692 Otitis media, unspecified, left ear: Secondary | ICD-10-CM | POA: Diagnosis not present

## 2015-05-06 MED ORDER — AMOXICILLIN-POT CLAVULANATE 875-125 MG PO TABS: 1.0000 | ORAL_TABLET | Freq: Two times a day (BID) | ORAL | Status: DC

## 2015-05-06 NOTE — Progress Notes (Signed)
   Subjective:    Patient ID: Billy Patterson, male    DOB: 11/21/1985, 30 y.o.   MRN: [id number]  HPI  Mr. Lefebre is a 30 year old male who presents today for follow up of acute otitis media.  He was evaluated on 04/28/15 with complaints of ear pain along with congestion, cough, nausea, sore throat, etc. He was found to have an acute otitis media to the left TM and was prescribed a course of Amoxil.   Since his last visit he's overall feeling improved. His main complaint is ear fullness and discomfort to the left ear. Denies fevers, sore throat, cough, nasal congestion. He completed the full course of the amoxicillin as prescribed. He's been using Mucinex, ibuprofen, Flonase without improvement.   Review of Systems  Constitutional: Negative for fever and chills.  HENT: Positive for ear pain. Negative for congestion, rhinorrhea and sinus pressure.   Respiratory: Negative for cough.        Past Medical History  Diagnosis Date  . Allergy   . ADHD (attention deficit hyperactivity disorder)     Social History   Social History  . Marital Status: Single    Spouse Name: N/A  . Number of Children: N/A  . Years of Education: N/A   Occupational History  . Not on file.   Social History Main Topics  . Smoking status: Never Smoker   . Smokeless tobacco: Never Used  . Alcohol Use: No  . Drug Use: No  . Sexual Activity: Not on file   Other Topics Concern  . Not on file   Social History Narrative    No past surgical history on file.  Family History  Problem Relation Age of Onset  . Heart disease Maternal Grandmother     cad  . Diabetes Maternal Grandfather     No Known Allergies  Current Outpatient Prescriptions on File Prior to Visit  Medication Sig Dispense Refill  . Dexmethylphenidate HCl (FOCALIN XR) 30 MG CP24 Take 1 capsule by mouth daily.     No current facility-administered medications on file prior to visit.    BP 126/80 mmHg  Pulse 74  Temp(Src) 97.8 F  (36.6 C) (Oral)  Ht  (1.803 m)  Wt 187 lb 12.8 oz (85.186 kg)  BMI 26.20 kg/m2  SpO2 99%    Objective:   Physical Exam  Constitutional: He appears well-nourished.  HENT:  Right Ear: Ear canal normal. Tympanic membrane is erythematous. Tympanic membrane is not bulging.  Left Ear: Ear canal normal. Tympanic membrane is erythematous. Tympanic membrane is not bulging.  Nose: Right sinus exhibits no maxillary sinus tenderness and no frontal sinus tenderness. Left sinus exhibits no maxillary sinus tenderness and no frontal sinus tenderness.  Mouth/Throat: Oropharynx is clear and moist.  Eyes: Conjunctivae are normal.  Neck: Neck supple.  Cardiovascular: Normal rate and regular rhythm.   Pulmonary/Chest: Effort normal and breath sounds normal.          Assessment & Plan:  Acute Otitis Media:  Continues to experience left ear discomfort and fullness. Overall improved since Amoxil course. No fevers, cough, congestion. Exam with moderate erythema to left TM, also mild erythema to right TM. No external tenderness to tragus or pinna. Will start Augmentin course for 7 days now. Continue flonase and ibuprofen. Return precautions provided.

## 2015-05-06 NOTE — Patient Instructions (Signed)
Start Augmentin antibiotics. Take 1 tablet by mouth twice daily for 7 days.  Continue Flonase and Ibuprofen as discussed.  Please call me if no improvement after you complete this medication.  It was a pleasure to see you today!

## 2015-05-06 NOTE — Progress Notes (Signed)
Pre visit review using our clinic review tool, if applicable. No additional management support is needed unless otherwise documented below in the visit note. 

## 2015-07-14 LAB — BASIC METABOLIC PANEL: GLUCOSE: 86 mg/dL

## 2015-07-14 LAB — LIPID PANEL
Cholesterol: 152 mg/dL (ref 0–200)
HDL: 39 mg/dL (ref 35–70)
LDL Cholesterol: 105 mg/dL
Triglycerides: 188 mg/dL — AB (ref 40–160)

## 2015-08-10 ENCOUNTER — Ambulatory Visit (INDEPENDENT_AMBULATORY_CARE_PROVIDER_SITE_OTHER): Payer: Managed Care, Other (non HMO) | Admitting: Internal Medicine

## 2015-08-10 ENCOUNTER — Encounter: Payer: Self-pay | Admitting: Internal Medicine

## 2015-08-10 VITALS — BP 110/70 | HR 86 | Temp 97.5°F | Wt 185.0 lb

## 2015-08-10 DIAGNOSIS — A09 Infectious gastroenteritis and colitis, unspecified: Secondary | ICD-10-CM | POA: Diagnosis not present

## 2015-08-10 DIAGNOSIS — R197 Diarrhea, unspecified: Secondary | ICD-10-CM | POA: Insufficient documentation

## 2015-08-10 NOTE — Progress Notes (Signed)
Pre visit review using our clinic review tool, if applicable. No additional management support is needed unless otherwise documented below in the visit note. 

## 2015-08-10 NOTE — Assessment & Plan Note (Signed)
Mild case Just some residual anorexia Reassured Take it easy restarting eating No pain  Note to return to work [employer]

## 2015-08-10 NOTE — Progress Notes (Signed)
   Subjective:    Patient ID: Billy Patterson, male    DOB: 01/14/1986, 30 y.o.   MRN: [id number]  HPI Here due to diarrhea At work last night--stomach acted up [employer]ent to bathroom---slightly runny stool Left work then  Then went home--just watery x 1 No problems today  No fever No abdominal pain but noisy Appetite is off  Current Outpatient Prescriptions on File Prior to Visit  Medication Sig Dispense Refill  . Dexmethylphenidate HCl (FOCALIN XR) 30 MG CP24 Take 1 capsule by mouth daily.     No current facility-administered medications on file prior to visit.    No Known Allergies  Past Medical History  Diagnosis Date  . Allergy   . ADHD (attention deficit hyperactivity disorder)     No past surgical history on file.  Family History  Problem Relation Age of Onset  . Heart disease Maternal Grandmother     cad  . Diabetes Maternal Grandfather     Social History   Social History  . Marital Status: Single    Spouse Name: N/A  . Number of Children: 0  . Years of Education: N/A   Occupational History  . Quality control      [employer]   Social History Main Topics  . Smoking status: Never Smoker   . Smokeless tobacco: Never Used  . Alcohol Use: No  . Drug Use: No  . Sexual Activity: Not on file   Other Topics Concern  . Not on file   Social History Narrative   Review of Systems  Mom sick 2 weeks ago---stomach No antibiotics in past 2 months No recent travel No suspect foods No cough or breathing problems Now going to psychiatrist     Objective:   Physical Exam  Constitutional: He appears well-developed and well-nourished. No distress.  Pulmonary/Chest: Effort normal and breath sounds normal. No respiratory distress. He has no wheezes. He has no rales.  Abdominal: Soft. He exhibits no distension and no mass. There is no tenderness. There is no rebound and no guarding.  Slightly decreased bowel sounds           Assessment & Plan:

## 2015-09-07 ENCOUNTER — Encounter: Payer: Self-pay | Admitting: Internal Medicine

## 2015-09-07 ENCOUNTER — Ambulatory Visit (INDEPENDENT_AMBULATORY_CARE_PROVIDER_SITE_OTHER): Payer: Managed Care, Other (non HMO) | Admitting: Internal Medicine

## 2015-09-07 VITALS — BP 102/76 | HR 83 | Temp 97.8°F | Ht 70.0 in | Wt 186.0 lb

## 2015-09-07 DIAGNOSIS — F900 Attention-deficit hyperactivity disorder, predominantly inattentive type: Secondary | ICD-10-CM

## 2015-09-07 DIAGNOSIS — Z0000 Encounter for general adult medical examination without abnormal findings: Secondary | ICD-10-CM | POA: Diagnosis not present

## 2015-09-07 NOTE — Progress Notes (Signed)
Pre visit review using our clinic review tool, if applicable. No additional management support is needed unless otherwise documented below in the visit note. 

## 2015-09-07 NOTE — Assessment & Plan Note (Signed)
Healthy Discussed regular fitness Tdap next year He will get copy of his labs

## 2015-09-07 NOTE — Assessment & Plan Note (Signed)
Sees Dr Jennelle Humanottle for his med

## 2015-09-07 NOTE — Progress Notes (Signed)
Subjective:    Patient ID: Billy Patterson, male    DOB: 05/22/1985, 30 y.o.   MRN: [id number]  HPI Here for physical  He has no new concerns Supposed to have evaluation per work Had blood work in May--he will get me a copy (looked good)  Not really exercising--but does have gym membership (had been going 3 days per week till the past month) Tries to eat healthy Still lives with parents  Sees psychiatrist-- Dr Jennelle Humanottle He prescribes the med for ADHD He is satisfied with this  Current Outpatient Prescriptions on File Prior to Visit  Medication Sig Dispense Refill  . Dexmethylphenidate HCl (FOCALIN XR) 30 MG CP24 Take 1 capsule by mouth daily.     No current facility-administered medications on file prior to visit.    No Known Allergies  Past Medical History  Diagnosis Date  . Allergy   . ADHD (attention deficit hyperactivity disorder)     No past surgical history on file.  Family History  Problem Relation Age of Onset  . Heart disease Maternal Grandmother     cad  . Diabetes Maternal Grandfather   . Hypertension Father     Social History   Social History  . Marital Status: Single    Spouse Name: N/A  . Number of Children: 0  . Years of Education: N/A   Occupational History  . Quality control      [employer]   Social History Main Topics  . Smoking status: Never Smoker   . Smokeless tobacco: Never Used  . Alcohol Use: No  . Drug Use: No  . Sexual Activity: Not on file   Other Topics Concern  . Not on file   Social History Narrative   Review of Systems  Constitutional: Negative for fatigue and unexpected weight change.       Wears seat belt  HENT: Positive for congestion. Negative for dental problem, hearing loss, tinnitus and trouble swallowing.        Keeps up with dentist  Eyes: Negative for visual disturbance.       No diplopia or unilateral vision loss  Respiratory: Negative for cough, chest tightness and shortness of breath.   Cardiovascular:     Only chest pain is with indigestion  Gastrointestinal: Negative for nausea, vomiting, abdominal pain, constipation and blood in stool.       Occ indigestion---uses nexium prn  Endocrine: Negative for polydipsia and polyuria.  Genitourinary: Negative for urgency, frequency and difficulty urinating.       No sexual problems  Musculoskeletal: Negative for back pain and joint swelling.       Mild pains related to his work  Skin: Negative for rash.  Allergic/Immunologic: Positive for environmental allergies. Negative for immunocompromised state.       Seasonal --- uses allegra or allegra D prn  Neurological: Positive for headaches. Negative for dizziness, syncope and light-headedness.  Hematological: Negative for adenopathy. Does not bruise/bleed easily.  Psychiatric/Behavioral: Negative for sleep disturbance and dysphoric mood. The patient is not nervous/anxious.        Sleeps better--moved to 1st shift       Objective:   Physical Exam  Constitutional: He is oriented to person, place, and time. He appears well-developed and well-nourished. No distress.  HENT:  Head: Normocephalic and atraumatic.  Right Ear: External ear normal.  Left Ear: External ear normal.  Mouth/Throat: Oropharynx is clear and moist. No oropharyngeal exudate.  Eyes: Conjunctivae are normal. Pupils are equal, round, and reactive  to light.  Neck: Normal range of motion. Neck supple. No thyromegaly present.  Cardiovascular: Normal rate, regular rhythm, normal heart sounds and intact distal pulses.  Exam reveals no gallop.   No murmur heard. Pulmonary/Chest: Effort normal and breath sounds normal. No respiratory distress. He has no wheezes. He has no rales.  Abdominal: Soft. There is no tenderness.  Musculoskeletal: He exhibits no edema or tenderness.  Lymphadenopathy:    He has no cervical adenopathy.  Neurological: He is oriented to person, place, and time.  Skin: No rash noted. No erythema.  Psychiatric: He has  a normal mood and affect. His behavior is normal.          Assessment & Plan:

## 2015-11-11 ENCOUNTER — Encounter: Payer: Self-pay | Admitting: Internal Medicine

## 2015-12-07 ENCOUNTER — Encounter: Payer: Self-pay | Admitting: Primary Care

## 2015-12-07 ENCOUNTER — Ambulatory Visit (INDEPENDENT_AMBULATORY_CARE_PROVIDER_SITE_OTHER): Payer: Managed Care, Other (non HMO) | Admitting: Primary Care

## 2015-12-07 VITALS — BP 122/72 | HR 80 | Temp 98.4°F | Ht 70.0 in | Wt 189.8 lb

## 2015-12-07 DIAGNOSIS — B9789 Other viral agents as the cause of diseases classified elsewhere: Secondary | ICD-10-CM

## 2015-12-07 DIAGNOSIS — J069 Acute upper respiratory infection, unspecified: Secondary | ICD-10-CM | POA: Diagnosis not present

## 2015-12-07 MED ORDER — BENZONATATE 200 MG PO CAPS: 200.0000 mg | ORAL_CAPSULE | Freq: Three times a day (TID) | ORAL | 0 refills | Status: DC | PRN

## 2015-12-07 NOTE — Progress Notes (Signed)
Pre visit review using our clinic review tool, if applicable. No additional management support is needed unless otherwise documented below in the visit note. 

## 2015-12-07 NOTE — Progress Notes (Signed)
   Subjective:    Patient ID: Billy Patterson, male    DOB: 11/26/1985, 30 y.o.   MRN: [id number]  HPI  Billy Patterson is a 30 year old male who presents today with a chief complaint of sore throat. He also reports nasal congestion, cough, headaches. His symptoms began Friday (3 days ago). He denies fevers, nausea, chills, body aches, sick contacts. He's taken Dayquil and Mucinex with some improvement. His most bothersome symptom is cough which is present during day and night.   Review of Systems  Constitutional: Negative for chills, fatigue and fever.  HENT: Positive for congestion, postnasal drip and sinus pressure. Negative for ear pain and sore throat.   Respiratory: Positive for cough. Negative for shortness of breath and wheezing.   Neurological: Positive for headaches.       Past Medical History:  Diagnosis Date  . ADHD (attention deficit hyperactivity disorder)   . Allergy      Social History   Social History  . Marital status: Single    Spouse name: N/A  . Number of children: 0  . Years of education: N/A   Occupational History  . Quality control      [employer]   Social History Main Topics  . Smoking status: Never Smoker  . Smokeless tobacco: Never Used  . Alcohol use No  . Drug use: No  . Sexual activity: Not on file   Other Topics Concern  . Not on file   Social History Narrative  . No narrative on file    No past surgical history on file.  Family History  Problem Relation Age of Onset  . Heart disease Maternal Grandmother     cad  . Diabetes Maternal Grandfather   . Hypertension Father     No Known Allergies  Current Outpatient Prescriptions on File Prior to Visit  Medication Sig Dispense Refill  . Dexmethylphenidate HCl (FOCALIN XR) 30 MG CP24 Take 1 capsule by mouth daily.     No current facility-administered medications on file prior to visit.     BP 122/72   Pulse 80   Temp 98.4 F (36.9 C) (Oral)   Ht 5\' 10"  (1.778 m)   Wt 189 lb 12.8 oz  (86.1 kg)   SpO2 98%   BMI 27.23 kg/m    Objective:   Physical Exam  Constitutional: He appears well-nourished.  HENT:  Right Ear: Tympanic membrane and ear canal normal.  Left Ear: Tympanic membrane and ear canal normal.  Nose: No mucosal edema. Right sinus exhibits no maxillary sinus tenderness and no frontal sinus tenderness. Left sinus exhibits no maxillary sinus tenderness and no frontal sinus tenderness.  Mouth/Throat: Oropharynx is clear and moist.  Eyes: Conjunctivae are normal.  Neck: Neck supple.  Cardiovascular: Normal rate and regular rhythm.   Pulmonary/Chest: Effort normal and breath sounds normal. He has no wheezes. He has no rales.  Skin: Skin is warm and dry.          Assessment & Plan:  URI:  Cough, congestion, headaches x 3 days. Some improvement with OTC treatment. Exam today with clear lungs, does not appear ill, vitals stable. Suspect viral involvement and will treat with supportive measures. Rx for Tessalon Pearls sent to pharmacy. Continue Mucinex. Fluids, rest. Return precautions provided.  Morrie Sheldonlark,Billy Matich Kendal, NP

## 2015-12-07 NOTE — Patient Instructions (Signed)
Your symptoms are representative of a viral illness which will resolve on its own over time. Our goal is to treat your symptoms in order to aid your body in the healing process and to make you more comfortable.   You may take Benzonatate capsules for cough. Take 1 capsule by mouth three times daily as needed for cough.  Cough/Congestion: Try taking Mucinex DM. This will help loosen up the mucous in your chest. Ensure you take this medication with a full glass of water.  Nasal Congestion/Ear Pressure: Try using Flonase (fluticasone) nasal spray. Instill 1 spray in each nostril twice daily.   Please notify me if you develop persistent fevers of 101, start coughing up green mucous, notice increased fatigue or weakness, or feel worse after 1 week of onset of symptoms.   Increase consumption of water intake and rest.  It was a pleasure to see you today!   Upper Respiratory Infection, Adult Most upper respiratory infections (URIs) are a viral infection of the air passages leading to the lungs. A URI affects the nose, throat, and upper air passages. The most common type of URI is nasopharyngitis and is typically referred to as "the common cold." URIs run their course and usually go away on their own. Most of the time, a URI does not require medical attention, but sometimes a bacterial infection in the upper airways can follow a viral infection. This is called a secondary infection. Sinus and middle ear infections are common types of secondary upper respiratory infections. Bacterial pneumonia can also complicate a URI. A URI can worsen asthma and chronic obstructive pulmonary disease (COPD). Sometimes, these complications can require emergency medical care and may be life threatening.  CAUSES Almost all URIs are caused by viruses. A virus is a type of germ and can spread from one person to another.  RISKS FACTORS You may be at risk for a URI if:   You smoke.   You have chronic heart or lung  disease.  You have a weakened defense (immune) system.   You are very young or very old.   You have nasal allergies or asthma.  You work in crowded or poorly ventilated areas.  You work in health care facilities or schools. SIGNS AND SYMPTOMS  Symptoms typically develop 2-3 days after you come in contact with a cold virus. Most viral URIs last 7-10 days. However, viral URIs from the influenza virus (flu virus) can last 14-18 days and are typically more severe. Symptoms may include:   Runny or stuffy (congested) nose.   Sneezing.   Cough.   Sore throat.   Headache.   Fatigue.   Fever.   Loss of appetite.   Pain in your forehead, behind your eyes, and over your cheekbones (sinus pain).  Muscle aches.  DIAGNOSIS  Your health care provider may diagnose a URI by:  Physical exam.  Tests to check that your symptoms are not due to another condition such as:  Strep throat.  Sinusitis.  Pneumonia.  Asthma. TREATMENT  A URI goes away on its own with time. It cannot be cured with medicines, but medicines may be prescribed or recommended to relieve symptoms. Medicines may help:  Reduce your fever.  Reduce your cough.  Relieve nasal congestion. HOME CARE INSTRUCTIONS   Take medicines only as directed by your health care provider.   Gargle warm saltwater or take cough drops to comfort your throat as directed by your health care provider.  Use a warm mist humidifier  or inhale steam from a shower to increase air moisture. This may make it easier to breathe.  Drink enough fluid to keep your urine clear or pale yellow.   Eat soups and other clear broths and maintain good nutrition.   Rest as needed.   Return to work when your temperature has returned to normal or as your health care provider advises. You may need to stay home longer to avoid infecting others. You can also use a face mask and careful hand washing to prevent spread of the  virus.  Increase the usage of your inhaler if you have asthma.   Do not use any tobacco products, including cigarettes, chewing tobacco, or electronic cigarettes. If you need help quitting, ask your health care provider. PREVENTION  The best way to protect yourself from getting a cold is to practice good hygiene.   Avoid oral or hand contact with people with cold symptoms.   Wash your hands often if contact occurs.  There is no clear evidence that vitamin C, vitamin E, echinacea, or exercise reduces the chance of developing a cold. However, it is always recommended to get plenty of rest, exercise, and practice good nutrition.  SEEK MEDICAL CARE IF:   You are getting worse rather than better.   Your symptoms are not controlled by medicine.   You have chills.  You have worsening shortness of breath.  You have brown or red mucus.  You have yellow or brown nasal discharge.  You have pain in your face, especially when you bend forward.  You have a fever.  You have swollen neck glands.  You have pain while swallowing.  You have white areas in the back of your throat. SEEK IMMEDIATE MEDICAL CARE IF:   You have severe or persistent:  Headache.  Ear pain.  Sinus pain.  Chest pain.  You have chronic lung disease and any of the following:  Wheezing.  Prolonged cough.  Coughing up blood.  A change in your usual mucus.  You have a stiff neck.  You have changes in your:  Vision.  Hearing.  Thinking.  Mood. MAKE SURE YOU:   Understand these instructions.  Will watch your condition.  Will get help right away if you are not doing well or get worse.   This information is not intended to replace advice given to you by your health care provider. Make sure you discuss any questions you have with your health care provider.   Document Released: 08/17/2000 Document Revised: 07/08/2014 Document Reviewed: 05/29/2013 Elsevier Interactive Patient Education  [employer]

## 2015-12-17 IMAGING — CR LEFT RING FINGER 2+V
1 series · 3 of 3 positions shown · non-contrast
Comparison: None

CLINICAL DATA: Football injury last night.

EXAM:
LEFT RING FINGER 2+V

[Series 1: pa · 0.17mm/px · 3 of 3 slices shown]
[im 1/3]
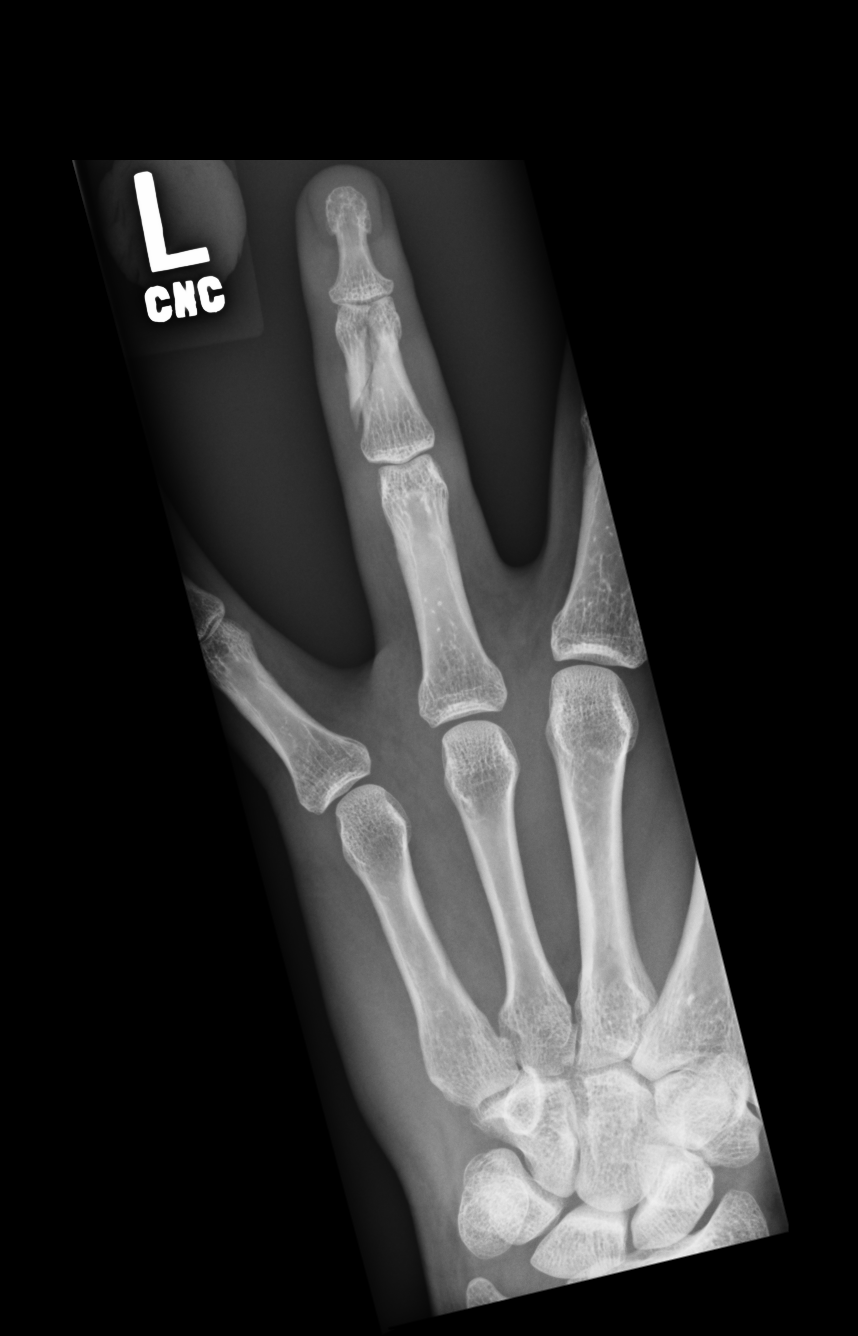
[im 2/3]
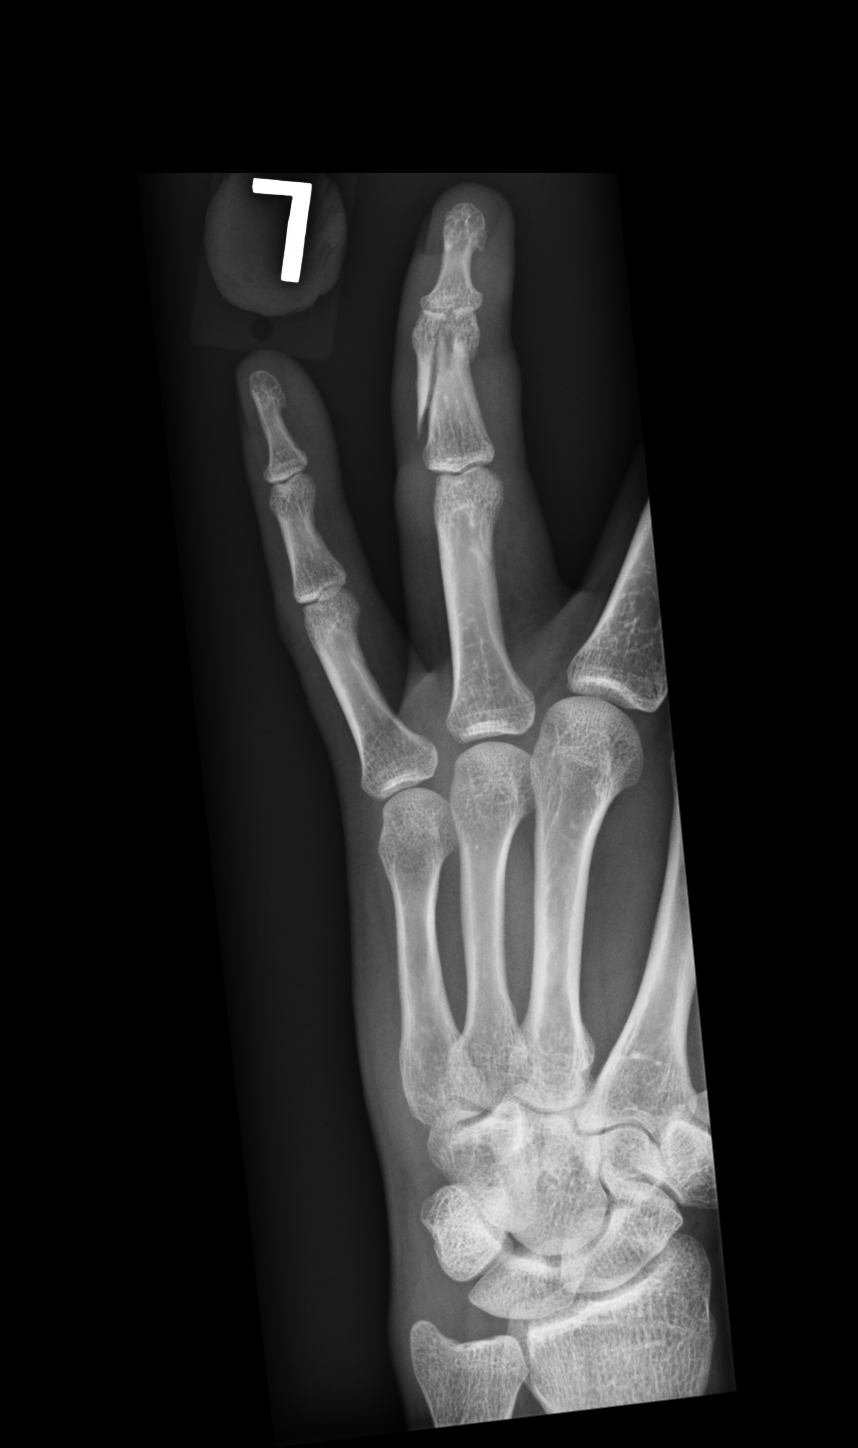
[im 3/3]
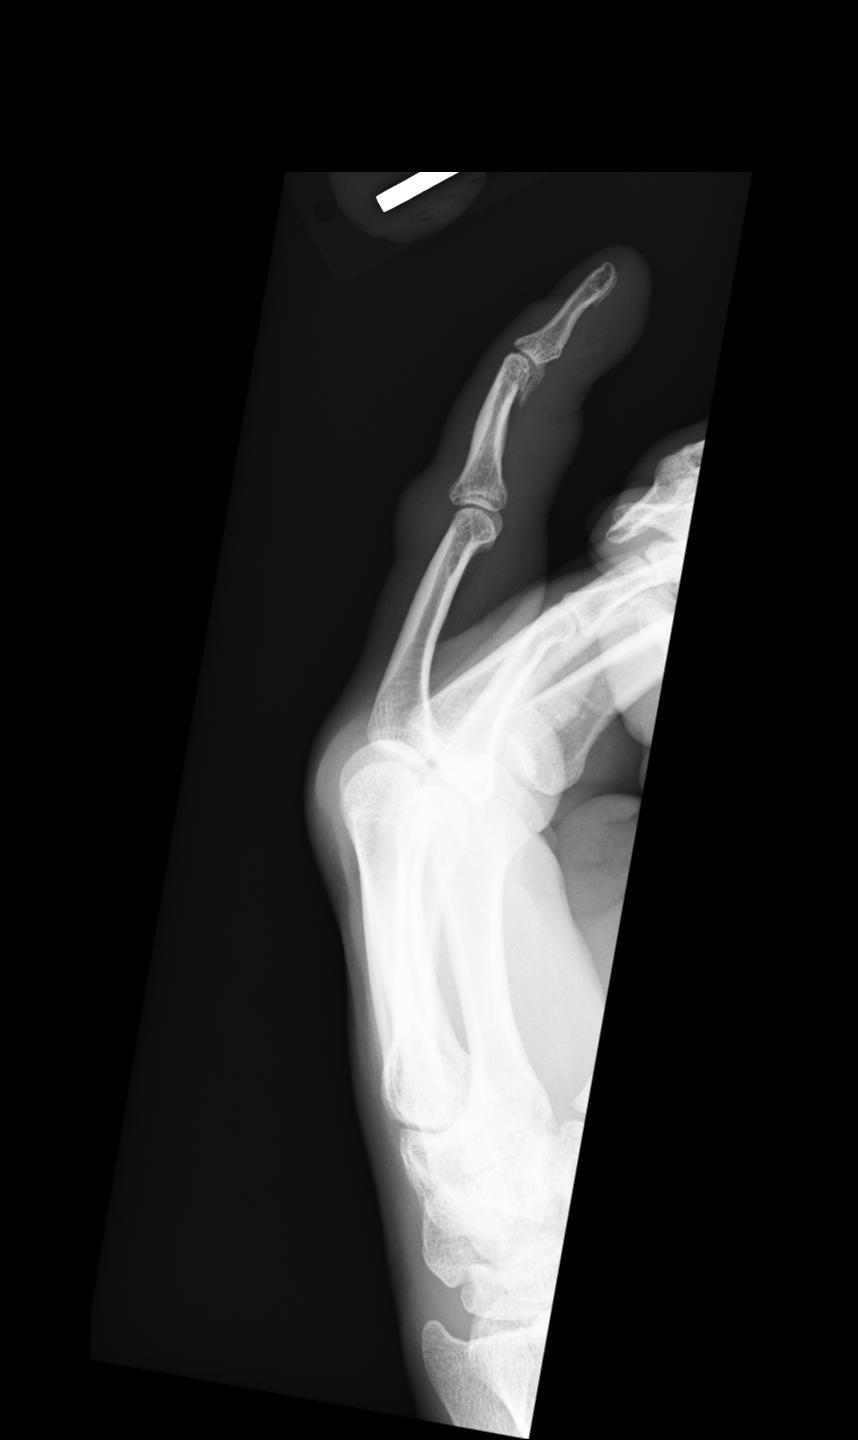

[3 of 3 positions shown; findings below may reference images not displayed]

FINDINGS: Fracture of the fourth middle phalanx extending into the DIP joint.
Fracture is mildly displaced. No dislocation.
IMPRESSION: Intra-articular fracture of the fourth middle phalanx extending into
the DIP joint.

## 2016-09-09 ENCOUNTER — Ambulatory Visit (INDEPENDENT_AMBULATORY_CARE_PROVIDER_SITE_OTHER): Payer: Commercial Managed Care - PPO | Admitting: Internal Medicine

## 2016-09-09 ENCOUNTER — Encounter: Payer: Self-pay | Admitting: Internal Medicine

## 2016-09-09 VITALS — BP 124/82 | HR 76 | Temp 97.8°F | Ht 70.5 in | Wt 184.0 lb

## 2016-09-09 DIAGNOSIS — Z0000 Encounter for general adult medical examination without abnormal findings: Secondary | ICD-10-CM | POA: Diagnosis not present

## 2016-09-09 DIAGNOSIS — Z23 Encounter for immunization: Secondary | ICD-10-CM | POA: Diagnosis not present

## 2016-09-09 NOTE — Progress Notes (Signed)
Subjective:    Patient ID: Billy Patterson, male    DOB: 02-28-1986, 31 y.o.   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 Dexmethylphenidate HCl (FOCALIN XR) 30 MG CP24 Take 1 capsule by mouth daily.     No current facility-administered medications on file prior to visit.     No Known Allergies  Past Medical History:  Diagnosis Date  . ADHD (attention deficit hyperactivity disorder)   . Allergy     No past surgical history on file.  Family History  Problem Relation Age of Onset  . Heart disease Maternal Grandmother        cad  . Diabetes Maternal Grandfather   . Hypertension Father     Social History   Social History  . Marital status: Single    Spouse name: N/A  . Number of children: 0  . Years of education: N/A   Occupational History  . Quality control      [employer]   Social History Main Topics  . Smoking status: Never Smoker  . Smokeless tobacco: Never Used  . Alcohol use No  . Drug use: No  . Sexual activity: Not on file   Other Topics Concern  . Not on file   Social History Narrative  . No narrative on file   Review of Systems  Constitutional: Negative for fatigue and unexpected weight change.       No regular exercise--discussed Wears seat belt  HENT: Negative for dental problem, hearing loss, tinnitus and trouble swallowing.        Keeps up with dentist  Eyes: Negative for visual disturbance.       No diplopia or unilateral vision loss  Gastrointestinal: Negative for abdominal pain, blood in stool and constipation.       Occ heartburn--- pepto or similar helps  Endocrine: Negative for polydipsia and polyuria.    Genitourinary: Negative for difficulty urinating and urgency.  Musculoskeletal: Negative for arthralgias, back pain and joint swelling.  Skin: Negative for rash.  Allergic/Immunologic: Positive for environmental allergies. Negative for immunocompromised state.       Uses OTC allegra  Neurological: Positive for headaches. Negative for dizziness, syncope and light-headedness.  Hematological: Negative for adenopathy. Does not bruise/bleed easily.  Psychiatric/Behavioral: Negative for dysphoric mood and sleep disturbance. The patient is not nervous/anxious.        Objective:   Physical Exam  Constitutional: He is oriented to person, place, and time. He appears well-developed and well-nourished. No distress.  HENT:  Head: Normocephalic and atraumatic.  Right Ear: External ear normal.  Left Ear: External ear normal.  Mouth/Throat: Oropharynx is clear and moist. No oropharyngeal exudate.  Eyes: Conjunctivae are normal. Pupils are equal, round, and reactive to light.  Neck: Normal range of motion. No thyromegaly present.  Cardiovascular: Normal rate, regular rhythm, normal heart sounds and intact distal pulses.  Exam reveals no gallop.   No murmur heard. Pulmonary/Chest: Effort normal and breath sounds normal. No respiratory distress. He has no wheezes. He has no rales.  Abdominal: Soft. There is no tenderness.  Musculoskeletal: He exhibits no edema.  Lymphadenopathy:    He has no cervical adenopathy.  Neurological: He  is alert and oriented to person, place, and time.  Skin: No rash noted. No erythema.  Psychiatric: He has a normal mood and affect. His behavior is normal.          Assessment & Plan:

## 2016-09-09 NOTE — Addendum Note (Signed)
Addended by: Eual FinesBRIDGES, Maddilyn Campus P on: 09/09/2016 11:23 AM   Modules accepted: Orders

## 2016-09-09 NOTE — Assessment & Plan Note (Signed)
Healthy Counseled on safety, exercise, safe sex Td booster today

## 2016-09-09 NOTE — Patient Instructions (Addendum)
Please send me a copy of the labs from [facility].  DASH Eating Plan DASH stands for "Dietary Approaches to Stop Hypertension." The DASH eating plan is a healthy eating plan that has been shown to reduce high blood pressure (hypertension). It may also reduce your risk for type 2 diabetes, heart disease, and stroke. The DASH eating plan may also help with weight loss. What are tips for following this plan? General guidelines  Avoid eating more than 2,300 mg (milligrams) of salt (sodium) a day. If you have hypertension, you may need to reduce your sodium intake to 1,500 mg a day.  Limit alcohol intake to no more than 1 drink a day for nonpregnant women and 2 drinks a day for men. One drink equals 12 oz of beer, 5 oz of wine, or 1 oz of hard liquor.  Work with your health care provider to maintain a healthy body weight or to lose weight. Ask what an ideal weight is for you.  Get at least 30 minutes of exercise that causes your heart to beat faster (aerobic exercise) most days of the week. Activities may include walking, swimming, or biking.  Work with your health care provider or diet and nutrition specialist (dietitian) to adjust your eating plan to your individual calorie needs. Reading food labels  Check food labels for the amount of sodium per serving. Choose foods with less than 5 percent of the Daily Value of sodium. Generally, foods with less than 300 mg of sodium per serving fit into this eating plan.  To find whole grains, look for the word "whole" as the first word in the ingredient list. Shopping  Buy products labeled as "low-sodium" or "no salt added."  Buy fresh foods. Avoid canned foods and premade or frozen meals. Cooking  Avoid adding salt when cooking. Use salt-free seasonings or herbs instead of table salt or sea salt. Check with your health care provider or pharmacist before using salt substitutes.  Do not fry foods. Cook foods using healthy methods such as baking, boiling,  grilling, and broiling instead.  Cook with heart-healthy oils, such as olive, canola, soybean, or sunflower oil. Meal planning   Eat a balanced diet that includes: ? 5 or more servings of fruits and vegetables each day. At each meal, try to fill half of your plate with fruits and vegetables. ? Up to 6-8 servings of whole grains each day. ? Less than 6 oz of lean meat, poultry, or fish each day. A 3-oz serving of meat is about the same size as a deck of cards. One egg equals 1 oz. ? 2 servings of low-fat dairy each day. ? A serving of nuts, seeds, or beans 5 times each week. ? Heart-healthy fats. Healthy fats called Omega-3 fatty acids are found in foods such as flaxseeds and coldwater fish, like sardines, salmon, and mackerel.  Limit how much you eat of the following: ? Canned or prepackaged foods. ? Food that is high in trans fat, such as fried foods. ? Food that is high in saturated fat, such as fatty meat. ? Sweets, desserts, sugary drinks, and other foods with added sugar. ? Full-fat dairy products.  Do not salt foods before eating.  Try to eat at least 2 vegetarian meals each week.  Eat more home-cooked food and less restaurant, buffet, and fast food.  When eating at a restaurant, ask that your food be prepared with less salt or no salt, if possible. What foods are recommended? The items listed may  not be a complete list. Talk with your dietitian about what dietary choices are best for you. Grains Whole-grain or whole-wheat bread. Whole-grain or whole-wheat pasta. Brown rice. [place]. Bulgur. Whole-grain and low-sodium cereals. Pita bread. Low-fat, low-sodium crackers. Whole-wheat flour tortillas. Vegetables Fresh or frozen vegetables (raw, steamed, roasted, or grilled). Low-sodium or reduced-sodium tomato and vegetable juice. Low-sodium or reduced-sodium tomato sauce and tomato paste. Low-sodium or reduced-sodium canned vegetables. Fruits All fresh, dried, or frozen  fruit. Canned fruit in natural juice (without added sugar). Meat and other protein foods Skinless chicken or [place]. Ground chicken or [place]. Pork with fat trimmed off. Fish and seafood. Egg whites. Dried beans, peas, or lentils. Unsalted nuts, nut butters, and seeds. Unsalted canned beans. Lean cuts of beef with fat trimmed off. Low-sodium, lean deli meat. Dairy Low-fat (1%) or fat-free (skim) milk. Fat-free, low-fat, or reduced-fat cheeses. Nonfat, low-sodium ricotta or cottage cheese. Low-fat or nonfat yogurt. Low-fat, low-sodium cheese. Fats and oils Soft margarine without trans fats. Vegetable oil. Low-fat, reduced-fat, or light mayonnaise and salad dressings (reduced-sodium). Canola, safflower, olive, soybean, and sunflower oils. Avocado. Seasoning and other foods Herbs. Spices. Seasoning mixes without salt. Unsalted popcorn and pretzels. Fat-free sweets. What foods are not recommended? The items listed may not be a complete list. Talk with your dietitian about what dietary choices are best for you. Grains Baked goods made with fat, such as croissants, muffins, or some breads. Dry pasta or rice meal packs. Vegetables Creamed or fried vegetables. Vegetables in a cheese sauce. Regular canned vegetables (not low-sodium or reduced-sodium). Regular canned tomato sauce and paste (not low-sodium or reduced-sodium). Regular tomato and vegetable juice (not low-sodium or reduced-sodium). Angie Fava. Olives. Fruits Canned fruit in a light or heavy syrup. Fried fruit. Fruit in cream or butter sauce. Meat and other protein foods Fatty cuts of meat. Ribs. Fried meat. Berniece Salines. Sausage. Bologna and other processed lunch meats. Salami. Fatback. Hotdogs. Bratwurst. Salted nuts and seeds. Canned beans with added salt. Canned or smoked fish. Whole eggs or egg yolks. Chicken or [place] with skin. Dairy Whole or 2% milk, cream, and half-and-half. Whole or full-fat cream cheese. Whole-fat or sweetened yogurt. Full-fat  cheese. Nondairy creamers. Whipped toppings. Processed cheese and cheese spreads. Fats and oils Butter. Stick margarine. Lard. Shortening. Ghee. Bacon fat. Tropical oils, such as coconut, palm kernel, or palm oil. Seasoning and other foods Salted popcorn and pretzels. Onion salt, garlic salt, seasoned salt, table salt, and sea salt. Worcestershire sauce. Tartar sauce. Barbecue sauce. Teriyaki sauce. Soy sauce, including reduced-sodium. Steak sauce. Canned and packaged gravies. Fish sauce. Oyster sauce. Cocktail sauce. Horseradish that you find on the shelf. Ketchup. Mustard. Meat flavorings and tenderizers. Bouillon cubes. Hot sauce and Tabasco sauce. Premade or packaged marinades. Premade or packaged taco seasonings. Relishes. Regular salad dressings. Where to find more information:  National Heart, Lung, and [place]: https://wilson-eaton.com/  American Heart Association: www.heart.org Summary  The DASH eating plan is a healthy eating plan that has been shown to reduce high blood pressure (hypertension). It may also reduce your risk for type 2 diabetes, heart disease, and stroke.  With the DASH eating plan, you should limit salt (sodium) intake to 2,300 mg a day. If you have hypertension, you may need to reduce your sodium intake to 1,500 mg a day.  When on the DASH eating plan, aim to eat more fresh fruits and vegetables, whole grains, lean proteins, low-fat dairy, and heart-healthy fats.  Work with your health care provider or diet and  nutrition specialist (dietitian) to adjust your eating plan to your individual calorie needs. This information is not intended to replace advice given to you by your health care provider. Make sure you discuss any questions you have with your health care provider. Document Released: 02/10/2011 Document Revised: 02/15/2016 Document Reviewed: 02/15/2016 Elsevier Interactive Patient Education  2017 [employer].

## 2018-02-06 ENCOUNTER — Encounter: Payer: Self-pay | Admitting: Emergency Medicine

## 2018-02-16 ENCOUNTER — Ambulatory Visit: Payer: Self-pay | Admitting: Psychiatry

## 2018-03-09 ENCOUNTER — Ambulatory Visit: Payer: Commercial Managed Care - PPO | Admitting: Psychiatry

## 2018-03-09 DIAGNOSIS — F900 Attention-deficit hyperactivity disorder, predominantly inattentive type: Secondary | ICD-10-CM

## 2018-03-09 MED ORDER — DEXMETHYLPHENIDATE HCL ER 30 MG PO CP24: ORAL_CAPSULE | ORAL | 0 refills | Status: DC

## 2018-03-09 MED ORDER — DEXMETHYLPHENIDATE HCL ER 30 MG PO CP24: 1.0000 | ORAL_CAPSULE | Freq: Every day | ORAL | 0 refills | Status: DC

## 2018-03-09 MED ORDER — DEXMETHYLPHENIDATE HCL ER 30 MG PO CP24: 30.0000 mg | ORAL_CAPSULE | Freq: Every day | ORAL | 0 refills | Status: DC

## 2018-03-09 NOTE — Progress Notes (Signed)
[facility]  Patient ID: Billy Patterson,  MRN: [id number]  PCP: Karie Schwalbe, MD  Date of Evaluation: 03/09/2018 Time spent:20 minutes  Chief Complaint:   HISTORY/CURRENT STATUS: HPI ADHD and he continues to do well.  Individual Medical History/ Review of Systems: Changes? :No   Allergies: Patient has no known allergies.  Current Medications:  Current Outpatient Medications:  .  Dexmethylphenidate HCl (FOCALIN XR) 30 MG CP24, Take 1 capsule (30 mg total) by mouth daily., Disp: 30 capsule, Rfl: 0 .  Dexmethylphenidate HCl (FOCALIN XR) 30 MG CP24, 1 a day, Disp: 30 capsule, Rfl: 0 .  [START ON 06/01/2018] Dexmethylphenidate HCl 30 MG CP24, Take 1 capsule (30 mg total) by mouth daily., Disp: 30 capsule, Rfl: 0 .  [START ON 06/01/2018] Dexmethylphenidate HCl 30 MG CP24, 1 a day, Disp: 30 capsule, Rfl: 0 .  [START ON 06/29/2018] Dexmethylphenidate HCl 30 MG CP24, Take 1 capsule (30 mg total) by mouth daily., Disp: 30 capsule, Rfl: 0 .  [START ON 06/29/2018] Dexmethylphenidate HCl 30 MG CP24, Take 1 capsule (30 mg total) by mouth daily., Disp: 30 capsule, Rfl: 0 Medication Side Effects: none  Family Medical/ Social History: Changes? no  MENTAL HEALTH EXAM:  There were no vitals taken for this visit.There is no height or weight on file to calculate BMI.  General Appearance: Casual  Eye Contact:  Good  Speech:  Normal Rate  Volume:  Normal  Mood:  Euthymic  Affect:  Appropriate  Thought Process:  Linear  Orientation:  Full (Time, Place, and Person)  Thought Content: Logical   Suicidal Thoughts:  No  Homicidal Thoughts:  No  Memory:  WNL  Judgement:  Good  Insight:  Good  Psychomotor Activity:  Normal  Concentration:  Concentration: Good  Recall:  Good  Fund of Knowledge: Good  Language: Good  Assets:  Desire for Improvement  ADL's:  Intact  Cognition: WNL  Prognosis:  Good    DIAGNOSES:    ICD-10-CM   1. Attention deficit hyperactivity disorder (ADHD),  predominantly inattentive type F90.0     Receiving Psychotherapy: No    RECOMMENDATIONS: Continue Focalin XR 30 mg a day.  Return in 6 months.   Anne Fu, PA-C

## 2018-03-23 ENCOUNTER — Telehealth: Payer: Self-pay

## 2018-03-23 NOTE — Telephone Encounter (Signed)
Prior Auth approved from [facility] for Dex/meth HCI ER 30 Mg 03/19/2018-03/19/2019.

## 2018-05-22 ENCOUNTER — Other Ambulatory Visit: Payer: Self-pay

## 2018-05-22 ENCOUNTER — Telehealth: Payer: Self-pay | Admitting: Psychiatry

## 2018-05-22 MED ORDER — DEXMETHYLPHENIDATE HCL ER 30 MG PO CP24: 30.0000 mg | ORAL_CAPSULE | Freq: Every day | ORAL | 0 refills | Status: DC

## 2018-05-22 NOTE — Telephone Encounter (Signed)
Should already have rx's on file at his pharmacy

## 2018-05-22 NOTE — Telephone Encounter (Signed)
Needs new rx submitted, rx has earliest fill date 03/27 but okay to get today. Last fill 02/16 per PMP

## 2018-05-22 NOTE — Telephone Encounter (Signed)
Pt needs refill on [facility]ocalin [facility] to [facility] on [address] [place], [place]

## 2018-09-05 ENCOUNTER — Ambulatory Visit: Payer: Commercial Managed Care - PPO | Admitting: Psychiatry

## 2018-09-05 ENCOUNTER — Encounter (INDEPENDENT_AMBULATORY_CARE_PROVIDER_SITE_OTHER): Payer: Self-pay

## 2018-09-05 ENCOUNTER — Other Ambulatory Visit: Payer: Self-pay

## 2018-09-05 ENCOUNTER — Encounter: Payer: Self-pay | Admitting: Psychiatry

## 2018-09-05 VITALS — BP 116/82 | HR 78 | Ht 71.0 in | Wt 190.0 lb

## 2018-09-05 DIAGNOSIS — F900 Attention-deficit hyperactivity disorder, predominantly inattentive type: Secondary | ICD-10-CM

## 2018-09-05 DIAGNOSIS — F8081 Childhood onset fluency disorder: Secondary | ICD-10-CM | POA: Insufficient documentation

## 2018-09-05 MED ORDER — DEXMETHYLPHENIDATE HCL ER 40 MG PO CP24: 40.0000 mg | ORAL_CAPSULE | Freq: Every day | ORAL | 0 refills | Status: DC

## 2018-09-05 NOTE — Progress Notes (Signed)
[facility]  Patient ID: Billy Patterson,  MRN: [id number]  PCP: Billy Carbon, MD  Date of Evaluation: 09/05/2018 Time spent:20 minutes from 1620 to 1640  Chief Complaint:  Chief Complaint    ADHD; Anxiety      HISTORY/CURRENT STATUS: Billy Patterson is seen onsite in office face-to-face individually with consent with epic and old office chart of previous provider Billy Patterson now deceased for psychiatric interview and exam in 75-month evaluation and management of ADHD and fluency disorder both starting early childhood with differential of primary though mild generalized anxiety which may be simply secondary to other diagnoses.  Billy Patterson has integrated previous care of child and adolescent psychiatrist Billy Patterson then that of Billy Patterson in his overall treatment here since 2013.  Billy Patterson had speech therapy throughout elementary school years but none since.  Management includes going to the gym when open again.  Earliest medication was Concerta subsequently treated with Focalin with Focalin prior approval done in mid spring here in the interim.  Though blood pressure has been monitored closely including by his girlfriend who has hypertension requiring medications, he has not had medication induced blood pressure problems.  He has no mania, psychosis, delirium, or suicidality.  Anxiety Presents for initial visit. Onset was more than 5 years ago. The problem has been waxing and waning. Symptoms include decreased concentration, muscle tension and nervous/anxious behavior. Patient reports no compulsions, depressed mood, obsessions, panic or suicidal ideas. Symptoms occur occasionally. The symptoms are aggravated by social activities and work stress. The quality of sleep is fair. Nighttime awakenings: occasional.   Risk factors include family history. His past medical history is significant for anxiety/panic attacks. There is no history of bipolar disorder, depression, hyperthyroidism  or suicide attempts. Past treatments include lifestyle changes and counseling (CBT). The treatment provided moderate relief. Prior compliance problems include pharmacy issues.    Individual Medical History/ Review of Systems: Changes? :No   Allergies: Patient has no known allergies.  Current Medications:  Current Outpatient Medications:  .  Dexmethylphenidate HCl 40 MG CP24, Take 1 capsule (40 mg total) by mouth daily after breakfast., Disp: 30 capsule, Rfl: 0 .  [START ON 10/05/2018] Dexmethylphenidate HCl 40 MG CP24, Take 1 capsule (40 mg total) by mouth daily after breakfast., Disp: 30 capsule, Rfl: 0 .  [START ON 11/04/2018] Dexmethylphenidate HCl 40 MG CP24, Take 1 capsule (40 mg total) by mouth daily after breakfast., Disp: 30 capsule, Rfl: 0 Medication Side Effects: none  Family Medical/ Social History: Changes? No, sister's ADHD was treated with Adderall XR in the past  MENTAL HEALTH EXAM:  Blood pressure 116/82, pulse 78, height 5\' 11"  (1.803 m), weight 190 lb (86.2 kg).Body mass index is 26.5 kg/m.  With boots  General Appearance: Casual and Fairly Groomed  Eye Contact:  Good  Speech:  Clear and Coherent, Normal Rate and Talkative though some stammering and stuttering chronically  Volume:  Normal  Mood:  Anxious and Euthymic  Affect:  Full Range and Anxious  Thought Process:  Goal Directed and Linear  Orientation:  Full (Time, Place, and Person)  Thought Content: Rumination   Suicidal Thoughts:  No  Homicidal Thoughts:  No  Memory:  Immediate;   Good Remote;   Good  Judgement:  Fair  Insight:  Fair  Psychomotor Activity:  Normal  Concentration:  Concentration: Good and Attention Span: Fair  Recall:  Good  Fund of Knowledge: Fair  Language: Fair  Assets:  Desire for Improvement Resilience  Talents/Skills  ADL's:  Intact  Cognition: WNL  Prognosis:  Good    DIAGNOSES:    ICD-10-CM   1. Attention deficit hyperactivity disorder (ADHD), inattentive type, severe   F90.0 Dexmethylphenidate HCl 40 MG CP24    Dexmethylphenidate HCl 40 MG CP24    Dexmethylphenidate HCl 40 MG CP24  2. Childhood onset fluency disorder  F80.81     Receiving Psychotherapy: No    RECOMMENDATIONS: 50% of the time is spent in counseling and coordination of care updating differential diagnosis for self-help in somatic/executive symptom management, attempting to optimize employment.  He processes relations with girlfriend and nuclear family as well as self-concept and future orientation.  He is Patterson scribed an increased dose of Focalin as 40 mg XR every morning sent as a 30-day supply each for July 1, July 31, and August 30 [facility] [place] with reeducation on prevention and monitoring, safety hygiene, and crisis plans if needed.  He returns for follow up in 6 months.  Billy Patterson , MD

## 2018-11-19 ENCOUNTER — Telehealth: Payer: Self-pay | Admitting: Psychiatry

## 2018-11-19 DIAGNOSIS — F900 Attention-deficit hyperactivity disorder, predominantly inattentive type: Secondary | ICD-10-CM

## 2018-11-19 MED ORDER — DEXMETHYLPHENIDATE HCL ER 35 MG PO CP24: 35.0000 mg | ORAL_CAPSULE | Freq: Every day | ORAL | 0 refills | Status: DC

## 2018-11-19 NOTE — Telephone Encounter (Signed)
When first seen 09/05/2018 in follow-up of January appointment with [name], the Focalin 30 mg XR was increased to 40 mg XR for clinical need, then patient dispensed 2 of the 3 eScriptions from 09/05/2018 but not yet that for 11/04/2018.  He now requests dose reduction for increased anxiety on the 40 mg to send therefore Focalin 35 mg XR every morning #30 with [facility] refill to [facility] in [place] with note to cancel remaining 40 mg eScription.

## 2018-11-19 NOTE — Telephone Encounter (Signed)
Patient called and said that he would like to go down on his dexmethylphendiate 40 mg to 35 mg because the 40 is giving him anxiety.[facility] [address] [facility] [place]

## 2018-12-31 ENCOUNTER — Telehealth: Payer: Self-pay | Admitting: Psychiatry

## 2018-12-31 NOTE — Telephone Encounter (Signed)
Phone review with Billy Patterson that he in using the 09/27/2018 eScription from Dr. Ouida Sills on 10/26/2018 then switching to the 35 mg XR here 11/19/1998 21-week early for scheduled fill then [facility] not cancel[facility] the 40 mg XR E scription's of Focalin from my 11/19/2018 note to them leaves the patient continuing the 40 mg XR he will need to exhaust before considering the 35 mg XR (or 30 mg Focalin) again.  He may wish to consider switching over to Dr. Ouida Sills if closer and more accessible for correcting these refills.  The patient will address this as he continues the current supply of 40 mg Focalin daily until next refill due.

## 2018-12-31 NOTE — Telephone Encounter (Signed)
Billy Patterson called to request refill of his dexmethyphenidate 35 mg.  Appt 03/12/19. [facility] send to [facility] in [place].  IF possible please send in 2 prescriptions to get him to his next appt.

## 2019-02-25 ENCOUNTER — Other Ambulatory Visit: Payer: Self-pay

## 2019-02-25 ENCOUNTER — Telehealth: Payer: Self-pay | Admitting: Psychiatry

## 2019-02-25 DIAGNOSIS — F900 Attention-deficit hyperactivity disorder, predominantly inattentive type: Secondary | ICD-10-CM

## 2019-02-25 MED ORDER — DEXMETHYLPHENIDATE HCL ER 35 MG PO CP24: 35.0000 mg | ORAL_CAPSULE | Freq: Every day | ORAL | 0 refills | Status: DC

## 2019-02-25 NOTE — Telephone Encounter (Signed)
Billy Patterson called to request refill of his Focalin.  Next appt 03/12/19.  Please send to [facility] on [address] in [place]

## 2019-02-25 NOTE — Telephone Encounter (Signed)
Last refill 01/27/2019 Pended for approval Apt 03/12/2019

## 2019-03-12 ENCOUNTER — Encounter: Payer: Self-pay | Admitting: Psychiatry

## 2019-03-12 ENCOUNTER — Other Ambulatory Visit: Payer: Self-pay

## 2019-03-12 ENCOUNTER — Ambulatory Visit (INDEPENDENT_AMBULATORY_CARE_PROVIDER_SITE_OTHER): Payer: Commercial Managed Care - PPO | Admitting: Psychiatry

## 2019-03-12 VITALS — Ht 71.0 in | Wt 192.0 lb

## 2019-03-12 DIAGNOSIS — F8081 Childhood onset fluency disorder: Secondary | ICD-10-CM

## 2019-03-12 DIAGNOSIS — F900 Attention-deficit hyperactivity disorder, predominantly inattentive type: Secondary | ICD-10-CM | POA: Diagnosis not present

## 2019-03-12 MED ORDER — DEXMETHYLPHENIDATE HCL ER 35 MG PO CP24: 35.0000 mg | ORAL_CAPSULE | Freq: Every day | ORAL | 0 refills | Status: DC

## 2019-03-12 NOTE — Progress Notes (Signed)
[facility]  Patient ID: Billy Patterson,  MRN: [id number]  PCP: Karie Schwalbe, MD  Date of Evaluation: 03/12/2019 Time spent:20 minutes from 1645 to 1705  Chief Complaint:  Chief Complaint    ADHD; Anxiety      HISTORY/CURRENT STATUS: Billy Patterson is seen onsite in office 20 minutes face-to-face individually with consent with epic collateral for psychiatric interview and exam in 3-month evaluation and management of ADHD with history of anxiety associated with childhood fluency disorder.  The patient at last appointment reviewed all these symptoms and consequences for management of executive function and any associated anxiety concluding to increase the Focalin from 30 to 40 mg XR every morning, Anne Fu noting at his appointment before last that the patient had significant fatigue particularly when the Focalin wore off.  After 3 months continue treatment, the patient phoned 12/31/2018 that he had compared as of late August and September dose of 35 mg XR with the 40 mg XR using a former prescription of Dr. Einar Crow finding the 35 mg allows the best match of focus without anxiety for optimal executive function.  In the interim subsequently reduced and reduced to her consistent 35 mg XR to continue at this time.  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  He reports he is not having any worry now that his dose is 35 mg XR and he sleeps well.  He has no winter blues or wear off fatigue or irritability.  [place] registry documents last fill 02/25/2019 though the [facility] registry assignment of doses to Dr. Dareen Piano or myself seems confused.  He has no mania, suicidality, psychosis or delirium.  Anxiety  Presents for subsequent visit. Onset was more than 5 years ago. The problem has been waxing and waning. Symptoms include decreased concentration, muscle tension, excessive  worry, performance anxiety before speech and language, and nervous/anxious behavior. Patient reports no compulsions, depressed mood, obsessions, panic or suicidal ideas. Symptoms occur occasionally. The symptoms are aggravated by social activities and work stress. The quality of sleep is fair. Nighttime awakenings: occasional. Risk factors include family history. His past medical history is significant for anxiety/panic attacks. There is no history of bipolar disorder, depression, hyperthyroidism or suicide attempts. Past treatments include lifestyle changes and counseling (CBT). The treatment provided moderate relief. Prior compliance problems   Individual Medical History/ Review of Systems: Changes? :Yes Weight is up 2 pounds over 6 months ago and patient has no interim medical concerns other than for his Focalin dose.  Patient notes he can best prevent stuttering and stammering or best communicate with optimal articulation by keeping his speech slow and not letting himself anxiously speed up.  Allergies: Patient has no known allergies.  Current Medications:  Current Outpatient Medications:  .  Dexmethylphenidate HCl 35 MG CP24, Take 35 mg by mouth daily after breakfast., Disp: 30 capsule, Rfl: 0  Medication Side Effects: none  Family Medical/ Social History: Changes? No  MENTAL HEALTH EXAM:  Height 5\' 11"  (1.803 m), weight 192 lb (87.1 kg).Body mass index is 26.78 kg/m. Muscle strengths and tone 5/5, postural reflexes and gait 0/0, and AIMS = 0 otherwise deferred for coronavirus shutdown  General Appearance: Casual, Fairly Groomed, Guarded and Meticulous  Eye Contact:  Fair  Speech:  Clear and Coherent, Garbled and Normal or Slow Rate   Volume:  Normal to decreased  Mood:  Anxious, Dysphoric and Euthymic  Affect:  Congruent, Inappropriate, Restricted and Anxious  Thought Process:  Coherent, Irrelevant, Linear and Descriptions of Associations: Tangential  Orientation:  Full (Time, Place,  and Person)  Thought Content: Rumination and Tangential   Suicidal Thoughts:  No  Homicidal Thoughts:  No  Memory:  Immediate;   Good Remote;   Good  Judgement:  Good  Insight:  Fair  Psychomotor Activity:  Normal, Decreased and Mannerisms  Concentration:  Concentration: Fair and Attention Span: Poor  Recall:  [employer] of Knowledge: Fair  Language: Fair  Assets:  Desire for Improvement Resilience Talents/Skills Vocational/Educational  ADL's:  Intact  Cognition: WNL  Prognosis:  Fair    DIAGNOSES:    ICD-10-CM   1. Attention deficit hyperactivity disorder (ADHD), inattentive type, severe  F90.0   2. Childhood onset fluency disorder  F80.81     Receiving Psychotherapy: No    RECOMMENDATIONS: Psychosupportive psychoeducation mobilizes as possible confident working through of anxious performance for exposure response prevention social skills and frustration management for optimal symptom treatment matching with Focalin for ADHD treatment needs.  Conclusion is to E scribe the Focalin 35 mg XR after breakfast every morning #30 each for January 20, February 19, and March 21 for ADHD finding the best negative function, fluency of speech, and associated confidence overcoming performance anxiety.  Update for prevention and monitoring and safety hygiene of medication is provided.  He agrees to return in 6 months for follow-up.  Over 50% of the face-to-face 20-minute time is spent in 10 minutes of counseling and coordination of care reviewing each element of this decision making and application process for his generalization to work and family life life in the community.    Delight Hoh, MD

## 2019-04-01 ENCOUNTER — Telehealth: Payer: Self-pay

## 2019-04-01 NOTE — Telephone Encounter (Signed)
Last appointment 03/12/2019 continued the Focalin 35 mg ER with [facility] responding to submission of prior authorization now by 03/31/2019 covering for 1 year.

## 2019-04-01 NOTE — Telephone Encounter (Signed)
Prior authorization submitted and approved for Dexmethylphenidate ER 35 mg capsules effective 03/31/2019-03/30/2020 with [facility].

## 2019-06-27 ENCOUNTER — Telehealth: Payer: Self-pay | Admitting: Psychiatry

## 2019-06-27 DIAGNOSIS — F900 Attention-deficit hyperactivity disorder, predominantly inattentive type: Secondary | ICD-10-CM

## 2019-06-27 MED ORDER — DEXMETHYLPHENIDATE HCL ER 35 MG PO CP24: 35.0000 mg | ORAL_CAPSULE | Freq: Every day | ORAL | 0 refills | Status: DC

## 2019-06-27 NOTE — Telephone Encounter (Signed)
Billy Patterson called to request refill of his dexmethylphenidate.  Appt 09/10/19.  Send to [facility] on [address] in [place]

## 2019-06-27 NOTE — Telephone Encounter (Signed)
Last office appointment 03/12/2019 dispensing the 30 scription for Focalin 35 mg XR every morning that day for 05/28/2019 so that is due for the next today sent to [facility] on [address] in [place] for interim to next appointment in July medically necessary no contraindication.

## 2019-07-29 ENCOUNTER — Telehealth: Payer: Self-pay | Admitting: Psychiatry

## 2019-07-29 DIAGNOSIS — F900 Attention-deficit hyperactivity disorder, predominantly inattentive type: Secondary | ICD-10-CM

## 2019-07-29 MED ORDER — DEXMETHYLPHENIDATE HCL ER 35 MG PO CP24: 35.0000 mg | ORAL_CAPSULE | Freq: Every day | ORAL | 0 refills | Status: DC

## 2019-07-29 NOTE — Telephone Encounter (Signed)
Patient is requesting refill on DEXMETHYLPHENIDATE. Please send to [facility], [address]

## 2019-07-29 NOTE — Telephone Encounter (Signed)
Last appointment January 5 with last dispensing per [place] registry 06/27/2019 medically necessary no contraindication for Focalin 35 mg XR every morning sent as #30 with no refill to [facility] on [address] due appointment in early July.

## 2019-08-27 ENCOUNTER — Other Ambulatory Visit: Payer: Self-pay

## 2019-08-27 ENCOUNTER — Telehealth: Payer: Self-pay | Admitting: Psychiatry

## 2019-08-27 DIAGNOSIS — F900 Attention-deficit hyperactivity disorder, predominantly inattentive type: Secondary | ICD-10-CM

## 2019-08-27 MED ORDER — DEXMETHYLPHENIDATE HCL ER 35 MG PO CP24: 35.0000 mg | ORAL_CAPSULE | Freq: Every day | ORAL | 0 refills | Status: DC

## 2019-08-27 NOTE — Telephone Encounter (Signed)
Last refill 07/29/2019, pended for Dr. Marlyne Beards to review and sign

## 2019-08-27 NOTE — Telephone Encounter (Signed)
PT requests RF of  Dexmethylphenidate 35mg  to a different pharmacy: [facility]  [facility], [place], [place] [address]

## 2019-09-10 ENCOUNTER — Other Ambulatory Visit: Payer: Self-pay

## 2019-09-10 ENCOUNTER — Encounter: Payer: Self-pay | Admitting: Psychiatry

## 2019-09-10 ENCOUNTER — Ambulatory Visit (INDEPENDENT_AMBULATORY_CARE_PROVIDER_SITE_OTHER): Payer: Commercial Managed Care - PPO | Admitting: Psychiatry

## 2019-09-10 VITALS — Ht 71.0 in | Wt 193.0 lb

## 2019-09-10 DIAGNOSIS — F8081 Childhood onset fluency disorder: Secondary | ICD-10-CM

## 2019-09-10 DIAGNOSIS — F900 Attention-deficit hyperactivity disorder, predominantly inattentive type: Secondary | ICD-10-CM | POA: Diagnosis not present

## 2019-09-10 MED ORDER — DEXMETHYLPHENIDATE HCL ER 35 MG PO CP24: 35.0000 mg | ORAL_CAPSULE | Freq: Every day | ORAL | 0 refills | Status: DC

## 2019-09-10 NOTE — Progress Notes (Deleted)
[facility]  Patient ID: Billy Patterson,  MRN: [id number]  PCP: Karie Schwalbe, MD  Date of Evaluation: 09/10/2019 Time spent:{TIME; 0 MIN TO 60 [phone]}  Chief Complaint:  Chief Complaint    ADHD; Trauma      HISTORY/CURRENT STATUS: HPI  Individual Medical History/ Review of Systems: Changes? :{EXAM; YES/NO:21197}  Allergies: Patient has no known allergies.  Current Medications:  Current Outpatient Medications:  .  [START ON 09/28/2019] Dexmethylphenidate HCl 35 MG CP24, Take 35 mg by mouth daily after breakfast., Disp: 30 capsule, Rfl: 0 .  [START ON 10/28/2019] Dexmethylphenidate HCl 35 MG CP24, Take 35 mg by mouth daily after breakfast., Disp: 30 capsule, Rfl: 0 .  [START ON 11/27/2019] Dexmethylphenidate HCl 35 MG CP24, Take 35 mg by mouth daily after breakfast., Disp: 30 capsule, Rfl: 0 Medication Side Effects: {Medication Side Effects (Optional):12147}  Family Medical/ Social History: Changes? {EXAM; YES/NO:19492::"No"}  MENTAL HEALTH EXAM:  Height 5\' 11"  (1.803 m), weight 193 lb (87.5 kg).Body mass index is 26.92 kg/m.  General Appearance: {PSY:[phone]}  Eye Contact:  {PSY:22684}  Speech:  {[phone]}  Volume:  {PSY:22686}  Mood:  {PSY:22306}  Affect:  {PSY:908-204-9874}  Thought Process:  {PSY:22688}  Orientation:  {PSY:22689}  Thought Content: {PSYt:22690}   Suicidal Thoughts:  {PSY:22692}  Homicidal Thoughts:  {PSY:22692}  Memory:  {PSY:[phone]}  Judgement:  {PSY:22694}  Insight:  {PSY:22695}  Psychomotor Activity:  {PSY:22696}  Concentration:  {PSY:21399}  Recall:  {PSY:22877}  Fund of Knowledge: {PSY:22877}  Language:  Assets:  {PSY:22698}  ADL's:  {PSY:22290}  Cognition: {PSY:[id number]}  Prognosis:  {PSY:22877}    DIAGNOSES:    ICD-10-CM   1. Attention deficit hyperactivity disorder (ADHD), inattentive type, severe  F90.0 Dexmethylphenidate HCl 35 MG CP24    Dexmethylphenidate HCl 35 MG CP24     Dexmethylphenidate HCl 35 MG CP24  2. Childhood onset fluency disorder  F80.81     Receiving Psychotherapy: {JIR:67893}   RECOMMENDATIONS: ***   {YBO:17510}, MD

## 2019-09-10 NOTE — Progress Notes (Signed)
[facility]  Patient ID: Billy Patterson,  MRN: [id number]  PCP: Karie Schwalbe, MD  Date of Evaluation: 09/10/2019 Time spent:25 minutes from 1655 to 1720  Chief Complaint:  Chief Complaint    ADHD; Trauma      HISTORY/CURRENT STATUS: Billy Patterson is seen onsite in office 25 minutes face-to-face individually with consent with epic collateral for psychiatric interview and exam in 3-month evaluation and management of ADHD and childhood fluency disorder having associated significant situational anxiety in past but now currently that could be related to both or to other stressors.  He has over time been assessed for depression or anxiety requiring treatment but seems to always improve without requiring more than his stimulant medication for ADHD.  He has been under the care of this office on Focalin since 2013 primarily by Anne Fu, [facility] after Dr. Jennelle Human but prior to that with Dr. Romeo Rabon. Though stuttering and ADHD are both reasonably contained, the patient has other sources of anxiety including current physical evaluation himself for dark stools possibly melena to have work-up including GME later this month for which he will have labs initially.  Fianc's mother has stomach cancer, and he and fianc plan to marry this October.  Patient's health is otherwise considered reasonably good still taking his Focalin 35 mg XR every morning with [place] registry documenting last Focalin 35 mg XR dispensing being 08/29/2019 down from 40 mg last October all fills appropriate and no contraindication or risk identified.  Patient allows clarification of all these strengths and vulnerabilities for steps and decisions about marriage and health.  He has no mania, suicidality, psychosis or delirium.   Anxiety             Presents for follow upvisit. Onset wasmore than 8 years ago. The problem has beenwaxing and waning. Symptoms includedecreased concentration,muscle tension, excessive worry, performance  anxiety better with speech therapy,phobic avoidance, and nervous/anxious behavior. Patient reports noobsessional thought or acts,depressed mood,panicor suicidal ideas. Symptoms occuroccasionally. The symptoms are aggravated bysocial activities and work stress. The quality of sleep isfair. Nighttime awakenings:occasional. Risk factors includefamily history. His past medical history is significant foranxiety/panic attacks. There is no history ofbipolar disorder,depression,hyperthyroidismor suicide attempts. Past treatments includelifestyle changes and counseling (CBT). The treatment providedmoderaterelief. Prior compliance problems   Individual Medical History/ Review of Systems: Changes? :Yes Patient having general medical exam with labs in next  weeks for possible melena  Allergies: Patient has no known allergies.  Current Medications:  Current Outpatient Medications:  .  [START ON 09/28/2019] Dexmethylphenidate HCl 35 MG CP24, Take 35 mg by mouth daily after breakfast., Disp: 30 capsule, Rfl: 0 .  [START ON 10/28/2019] Dexmethylphenidate HCl 35 MG CP24, Take 35 mg by mouth daily after breakfast., Disp: 30 capsule, Rfl: 0 .  [START ON 11/27/2019] Dexmethylphenidate HCl 35 MG CP24, Take 35 mg by mouth daily after breakfast., Disp: 30 capsule, Rfl: 0  Medication Side Effects: none  Family Medical/ Social History: Changes? No fiancs mother had stomach cancer while patient has some possible melena  MENTAL HEALTH EXAM:  Height 5\' 11"  (1.803 m), weight 193 lb (87.5 kg).Body mass index is 26.92 kg/m. Muscle strengths and tone 5/5, postural reflexes and gait 0/0, and AIMS = 0.  General Appearance: Casual, Fairly Groomed and Meticulous  Eye Contact:  Fair  Speech:  Clear and Coherent, Normal Rate and Talkative  Volume:  Normal  Mood:  Anxious, Dysphoric and Euthymic  Affect:  Congruent, Inappropriate, Restricted and Anxious  Thought Process:  Coherent, Goal Directed, Linear and  Descriptions of Associations: Tangential  Orientation:  Full (Time, Place, and Person)  Thought Content: Rumination and Tangential   Suicidal Thoughts:  No  Homicidal Thoughts:  No  Memory:  Immediate;   Good Remote;   Good  Judgement:  Fair  Insight:  Fair  Psychomotor Activity:  Normal and Mannerisms  Concentration:  Concentration: Fair and Attention Span: Fair  Recall:  [employer] of Knowledge: Good  Language: Fair  Assets:  Resilience Social Support Talents/Skills Vocational/Educational  ADL's:  Intact  Cognition: WNL  Prognosis:  Fair    DIAGNOSES:    ICD-10-CM   1. Attention deficit hyperactivity disorder (ADHD), inattentive type, severe  F90.0 Dexmethylphenidate HCl 35 MG CP24    Dexmethylphenidate HCl 35 MG CP24    Dexmethylphenidate HCl 35 MG CP24  2. Childhood onset fluency disorder  F80.81     Receiving Psychotherapy: No    RECOMMENDATIONS: Over 50% of the 25-minute face-to-face session time is spent in 15 minutes of counseling and coordination of care with cognitive behavioral nutrition, sleep hygiene, social skills, and frustration management for situational performance anxiety and generalized anxiety projected forward to upcoming marriage and associated responsibilities.  Patient has been in treatment for over 8 years coping for the moment only now to consolidate responsible adult skills and task completion for self-directed social relational mastery.  Symptom treatment matching concludes to continue Focalin with no current necessity per patient's to consider Prozac, Effexor, or Pamelor. Focalin 35 mg XR capsule the morning after breakfast this symptom has a 30-day supply each for July 24, August 23, and September 22 to [facility] [place] [facility] [address] for ADHD.  He returns for follow-up in 6 months or sooner if anxiety or negative functioning require.   Chauncey Mann, MD

## 2019-12-25 ENCOUNTER — Encounter: Payer: Self-pay | Admitting: Psychiatry

## 2019-12-26 ENCOUNTER — Telehealth: Payer: Self-pay | Admitting: Psychiatry

## 2019-12-26 ENCOUNTER — Other Ambulatory Visit: Payer: Self-pay

## 2019-12-26 DIAGNOSIS — F900 Attention-deficit hyperactivity disorder, predominantly inattentive type: Secondary | ICD-10-CM

## 2019-12-26 MED ORDER — DEXMETHYLPHENIDATE HCL ER 35 MG PO CP24: 35.0000 mg | ORAL_CAPSULE | Freq: Every day | ORAL | 0 refills | Status: DC

## 2019-12-26 NOTE — Telephone Encounter (Signed)
All 3 eScription's from 09/10/2019 are dispensed now needing another rescription Focalin 35 mg XR every morning #30 sent to [facility] [place] [facility] on [address] medically necessary with no contraindication from the last year per [facility] registry or epic.

## 2019-12-26 NOTE — Telephone Encounter (Signed)
Last refill 11/27/19 for Focalin XR 35 mg  Pended for Dr. Marlyne Beards

## 2019-12-26 NOTE — Telephone Encounter (Signed)
Pt called requesting refill for Focalin XR 35 mg @ [facility] [address] . [facility]S told pt he doesn't have any on file. Apt 1/11

## 2019-12-27 ENCOUNTER — Telehealth: Payer: Self-pay | Admitting: Psychiatry

## 2019-12-27 NOTE — Telephone Encounter (Signed)
Hasson called to check on status of the his Focalin refill.  I told him it was sent in yesterday to [facility] on [address].  He asked to have that pharmacy deleted from his chart.  He uses the [facility] on [facility].  He will go get his prescription from the one on [address] this time since it is there.  But again please delete the one on [address]. So we won't use it again.

## 2019-12-27 NOTE — Telephone Encounter (Signed)
Deleted pharmacy on [address] as requested

## 2020-01-24 ENCOUNTER — Telehealth: Payer: Self-pay | Admitting: Psychiatry

## 2020-01-24 NOTE — Telephone Encounter (Signed)
Jayd called to request refill of his dexmethylphenidate.  Appt 11/29.  Send to [facility], [place]

## 2020-01-25 ENCOUNTER — Other Ambulatory Visit: Payer: Self-pay

## 2020-01-25 DIAGNOSIS — F900 Attention-deficit hyperactivity disorder, predominantly inattentive type: Secondary | ICD-10-CM

## 2020-01-25 MED ORDER — DEXMETHYLPHENIDATE HCL ER 35 MG PO CP24: 35.0000 mg | ORAL_CAPSULE | Freq: Every day | ORAL | 0 refills | Status: DC

## 2020-01-25 NOTE — Telephone Encounter (Signed)
3rd fil from appt July 6 was dispensed 11/27/2019 the next sent for 12/26/2019 next now due no contraindication per [place] Registry in last year or epic due appt 02/03/2020.

## 2020-01-25 NOTE — Telephone Encounter (Signed)
Last refill 12/26/19 Next apt 02/03/20 Pended for Dr. Marlyne Beards to send

## 2020-02-03 ENCOUNTER — Ambulatory Visit: Payer: Commercial Managed Care - PPO | Admitting: Psychiatry

## 2020-02-04 ENCOUNTER — Other Ambulatory Visit: Payer: Self-pay

## 2020-02-04 ENCOUNTER — Encounter: Payer: Self-pay | Admitting: Psychiatry

## 2020-02-04 ENCOUNTER — Ambulatory Visit (INDEPENDENT_AMBULATORY_CARE_PROVIDER_SITE_OTHER): Payer: Commercial Managed Care - PPO | Admitting: Psychiatry

## 2020-02-04 VITALS — Ht 71.0 in | Wt 187.0 lb

## 2020-02-04 DIAGNOSIS — F4323 Adjustment disorder with mixed anxiety and depressed mood: Secondary | ICD-10-CM | POA: Diagnosis not present

## 2020-02-04 DIAGNOSIS — F900 Attention-deficit hyperactivity disorder, predominantly inattentive type: Secondary | ICD-10-CM | POA: Diagnosis not present

## 2020-02-04 DIAGNOSIS — F8081 Childhood onset fluency disorder: Secondary | ICD-10-CM | POA: Diagnosis not present

## 2020-02-04 MED ORDER — DEXMETHYLPHENIDATE HCL ER 35 MG PO CP24: 35.0000 mg | ORAL_CAPSULE | Freq: Every day | ORAL | 0 refills | Status: DC

## 2020-02-04 NOTE — Progress Notes (Signed)
[facility]  Patient ID: Billy Patterson,  MRN: [id number]  PCP: Billy Schwalbe, MD  Date of Evaluation: 02/04/2020 Time spent:20 minutes from 15000 to 1520  Chief Complaint:  Chief Complaint    ADHD; Stress; Family Problem      HISTORY/CURRENT STATUS: Billy Patterson is seen onsite in office 20 minutes face-to-face individually with consent with epic collateral for psychiatric interview and exam in 47-month evaluation and management of ADHD, adjustment disorder with mixed emotional features, and childhood onset fluency disorder. Patient has been dispensed all 3 eScription's from 09/10/2019 last appointment followed by 2 more month supplies E scribed for the month each last on 01/25/2020 patient switching to the [employer] from the [facility] [place] after separating from his new wife of October 15 days after their marriage. Patient has discussed the planned marriage at last appointment stating he thought that his fiance would be less controlling after their marriage, but she became more controlling even interrupting his employment so that they separated after 15 days but could not get the marriage annulled. Since last appointment, he has had his general medical exam August 4 as he and wife had been worried that his GI symptoms might be major problem like the fianc's mother's stomach cancer. Patient was placed on probiotics helping reflux and changed his diet having triglycerides elevated at 188 with upper limit of normal at 160, and now his GI symptoms are resolved. Fluency problems and speech are the same. However he does not wish to continue care here but has a plan for a provider in [place] to take over. We review his previous 7 years of care with Billy Fu, PA-C last contact Apr 13, 2020before Billy Patterson's decease subsequently transferred to my care for four subsequent sessions the last today. Patient can verbally process anxiety and grief over family events though concluding  himself that he is better off without the fianc than with her. His weight is down 6 pounds in 5 months, and diet is improved so that he anticipates continued improvement just needing his Focalin 35 mg XR every morning. We process my imminent retirement for case closure and patient's transfer to provider in [place] patient having no mania, suicidality, psychosis or delirium.   Anxiety Presents for follow upvisit. Onset wasmore than 9 years ago. The problem has beenwaxing and waning. Symptoms includedecreased concentration,muscle tension,excessive worry, performance anxiety better with speech therapy for stuttering,phobic avoidance, somatoform dysfunction,and nervous/anxious behavior. Patient reports noobsessional thought or acts,depressed mood,panicor suicidal ideas. Symptoms occuroccasionally. The symptoms are aggravated bysocial activities and work stress. The quality of sleep isfair. Nighttime awakenings:occasional. Risk factors includefamily history. His past medical history is significant foranxiety/panic attacks. There is no history ofbipolar disorder,depression,hyperthyroidismor suicide attempts. Past treatments includelifestyle changes and counseling (CBT). The treatment providedmoderaterelief. Prior compliance problems  Individual Medical History/ Review of Systems: Changes? :Yes weight is down 6 pounds in 5  months Lipid panel Order: [id number] Status:  Edited Result - FINAL Visible to patient:  Yes (seen) Next appt:  None  0 Result Notes  Ref Range & Units 4 yr ago  Triglycerides 40 - 160 mg/dL [id number]   Cholesterol 0 - 200 mg/dL 569   HDL 35 - 70 mg/dL 39   LDL Cholesterol mg/dL 794         Allergies: Patient has no known allergies.  Current Medications:  Current Outpatient Medications:  .  [START ON 02/24/2020] Dexmethylphenidate HCl 35 MG CP24, Take 35 mg by mouth daily after breakfast., Disp: 30  capsule, Rfl: 0 .  [START ON  03/25/2020] Dexmethylphenidate HCl 35 MG CP24, Take 35 mg by mouth daily after breakfast., Disp: 30 capsule, Rfl: 0 .  [START ON 04/24/2020] Dexmethylphenidate HCl 35 MG CP24, Take 35 mg by mouth daily after breakfast., Disp: 30 capsule, Rfl: 0  Medication Side Effects: none  Family Medical/ Social History: Changes? No previously noting sister's ADHD was treated with Adderall XR    MENTAL HEALTH EXAM:  Height 5\' 11"  (1.803 m), weight 187 lb (84.8 kg).Body mass index is 26.08 kg/m. Muscle strengths and tone 5/5, postural reflexes and gait 0/0, and AIMS = 0.  General Appearance: Casual, Fairly Groomed and Guarded  Eye Contact:  Fair  Speech:  Clear and Coherent, Talkative and dysfluent  Volume:  Normal  Mood:  Anxious and Dysphoric  Affect:  Congruent, Full Range and Anxious  Thought Process:  Coherent, Goal Directed, Linear and Descriptions of Associations: Tangential  Orientation:  Full (Time, Place, and Person)  Thought Content: Rumination and Tangential   Suicidal Thoughts:  No  Homicidal Thoughts:  No  Memory:  Immediate;   Good Remote;   Good  Judgement:  Fair  Insight:  Fair  Psychomotor Activity:  Normal and Mannerisms  Concentration:  Concentration: Fair and Attention Span: Fair  Recall:  Good  Fund of Knowledge: Good  Language: Fair  Assets:  Desire for Improvement Leisure Time Resilience Vocational/Educational  ADL's:  Intact  Cognition: WNL  Prognosis:  Good    DIAGNOSES:    ICD-10-CM   1. Attention deficit hyperactivity disorder (ADHD), inattentive type, severe  F90.0 Dexmethylphenidate HCl 35 MG CP24    Dexmethylphenidate HCl 35 MG CP24    Dexmethylphenidate HCl 35 MG CP24  2. Childhood onset fluency disorder  F80.81   3. Adjustment disorder with mixed anxiety and depressed mood  F43.23     Receiving Psychotherapy: No    RECOMMENDATIONS: Patient seems to be building a new adult life organized around his employment consolidating his care closer to home.   He has taken only Focalin from this office though Dr. before care here documented Ritalin and Concerta in the past and deferred any SSRI.  Over 50% of the 20-minute face-to-face session time is spent in 10 minutes of counseling and coordination of care documenting patient's safety, capacity to continue to work through the relationship as he reports a year will be required to get full divorce, and updating integrated options from past treatment for future stabilization if needed.  Patient is health-conscious and much more comprehensive lately.  He is E scribed Focalin 35 mg XR capsule every morning after breakfast sent as #30 each for December 20, January 19, and February 18 as  Registry notes dispensing of Focalin for 30 on November 20 [facility] [address] instead of [facility]  Case closure for my retirement and his transfer of care is completed and resolving.    Mikki Santee, MD

## 2020-03-17 ENCOUNTER — Ambulatory Visit: Payer: Commercial Managed Care - PPO | Admitting: Psychiatry

## 2020-04-03 ENCOUNTER — Telehealth: Payer: Self-pay

## 2020-04-03 NOTE — Telephone Encounter (Signed)
Renewal prior approval received for DEXMETHYLPHENIDATE 35 MG CAP effective 04/03/2020-04/03/2021 with [employer].

## 2020-08-19 ENCOUNTER — Encounter: Payer: Self-pay | Admitting: Podiatry

## 2020-08-19 ENCOUNTER — Ambulatory Visit (INDEPENDENT_AMBULATORY_CARE_PROVIDER_SITE_OTHER): Payer: Commercial Managed Care - PPO

## 2020-08-19 ENCOUNTER — Other Ambulatory Visit: Payer: Self-pay

## 2020-08-19 ENCOUNTER — Ambulatory Visit: Payer: Commercial Managed Care - PPO | Admitting: Podiatry

## 2020-08-19 DIAGNOSIS — M722 Plantar fascial fibromatosis: Secondary | ICD-10-CM | POA: Diagnosis not present

## 2020-08-19 DIAGNOSIS — M216X1 Other acquired deformities of right foot: Secondary | ICD-10-CM | POA: Diagnosis not present

## 2020-08-19 DIAGNOSIS — M21861 Other specified acquired deformities of right lower leg: Secondary | ICD-10-CM

## 2020-08-19 MED ORDER — MELOXICAM 15 MG PO TABS: 15.0000 mg | ORAL_TABLET | Freq: Every day | ORAL | 3 refills | Status: DC

## 2020-08-19 NOTE — Progress Notes (Signed)
  Subjective:  Patient ID: Billy Patterson, male    DOB: September 23, 1985,  MRN: [id number]  Chief Complaint  Patient presents with   Foot Pain    Patient presents today for right heel pain off and on x 1 year.  He says its the worst in the mornings or when he first stands from sitting.  "It feels like a knife stabbing my heel"      35 y.o. male presents with the above complaint. History confirmed with patient.  Works in Set designer on hard concrete floors  Objective:  Physical Exam: warm, good capillary refill, no trophic changes or ulcerative lesions, normal DP and PT pulses, and normal sensory exam.  Right Foot: point tenderness over the heel pad significant gastrocnemius equinus  Radiographs: X-ray of the right foot: no fracture, dislocation, swelling or degenerative changes noted and plantar calcaneal spur Assessment:   1. Plantar fasciitis of right foot   2. Gastrocnemius equinus of right lower extremity      Plan:  Patient was evaluated and treated and all questions answered.  Discussed the etiology and treatment options for plantar fasciitis including stretching, formal physical therapy, supportive shoegears such as a running shoe or sneaker, pre fabricated orthoses, injection therapy, and oral medications. We also discussed the role of surgical treatment of this for patients who do not improve after exhausting non-surgical treatment options.   -XR reviewed with patient -Educated patient on stretching and icing of the affected limb -Night splint dispensed, he has significant equinus contributing to this -Injection delivered to the plantar fascia of the right foot. -Rx for meloxicam. Educated on use, risks and benefits of the medication   Return in about 1 month (around 09/18/2020) for recheck plantar fasciitis.

## 2020-08-19 NOTE — Patient Instructions (Signed)

## 2020-09-16 ENCOUNTER — Ambulatory Visit: Payer: Commercial Managed Care - PPO | Admitting: Podiatry

## 2020-10-07 ENCOUNTER — Other Ambulatory Visit: Payer: Self-pay

## 2020-10-07 ENCOUNTER — Ambulatory Visit: Payer: Commercial Managed Care - PPO | Admitting: Podiatry

## 2020-10-07 DIAGNOSIS — M21861 Other specified acquired deformities of right lower leg: Secondary | ICD-10-CM

## 2020-10-07 DIAGNOSIS — M216X1 Other acquired deformities of right foot: Secondary | ICD-10-CM

## 2020-10-07 DIAGNOSIS — M722 Plantar fascial fibromatosis: Secondary | ICD-10-CM

## 2020-10-11 ENCOUNTER — Encounter: Payer: Self-pay | Admitting: Podiatry

## 2020-10-11 NOTE — Progress Notes (Signed)
  Subjective:  Patient ID: Billy Patterson, male    DOB: 06-19-1985,  MRN: [id number]  Plantar fasciitis on the right side was doing better but now it hurts again   35 y.o. male presents with the above complaint. History confirmed with patient.    Objective:  Physical Exam: warm, good capillary refill, no trophic changes or ulcerative lesions, normal DP and PT pulses, and normal sensory exam.  Right Foot: point tenderness over the heel pad significant gastrocnemius equinus  Radiographs: X-ray of the right foot: no fracture, dislocation, swelling or degenerative changes noted and plantar calcaneal spur Assessment:   No diagnosis found.    Plan:  Patient was evaluated and treated and all questions answered.  Discussed the etiology and treatment options for plantar fasciitis including stretching, formal physical therapy, supportive shoegears such as a running shoe or sneaker, pre fabricated orthoses, injection therapy, and oral medications. We also discussed the role of surgical treatment of this for patients who do not improve after exhausting non-surgical treatment options.   -Continue home exercise plan with stretching and icing of the affected limb -Continue using night splint he has significant equinus contributing to this -Repeat injection delivered to the plantar fascia of the right foot. -Continue meloxicam -If not improving with this would recommend CAM boot and PT   Return if symptoms worsen or fail to improve.

## 2020-11-17 DIAGNOSIS — S62639A Displaced fracture of distal phalanx of unspecified finger, initial encounter for closed fracture: Secondary | ICD-10-CM | POA: Insufficient documentation

## 2020-11-17 DIAGNOSIS — M20012 Mallet finger of left finger(s): Secondary | ICD-10-CM | POA: Insufficient documentation

## 2020-12-22 ENCOUNTER — Other Ambulatory Visit: Payer: Self-pay | Admitting: Podiatry

## 2021-01-18 ENCOUNTER — Other Ambulatory Visit: Payer: Self-pay

## 2021-01-18 ENCOUNTER — Ambulatory Visit: Payer: Commercial Managed Care - PPO | Admitting: Podiatry

## 2021-01-18 DIAGNOSIS — L600 Ingrowing nail: Secondary | ICD-10-CM | POA: Diagnosis not present

## 2021-01-18 MED ORDER — NEOMYCIN-POLYMYXIN-HC 1 % OT SOLN: OTIC | 0 refills | Status: DC

## 2021-01-18 MED ORDER — TERBINAFINE HCL 250 MG PO TABS: 250.0000 mg | ORAL_TABLET | Freq: Every day | ORAL | 0 refills | Status: AC

## 2021-01-18 NOTE — Patient Instructions (Signed)

## 2021-01-19 NOTE — Progress Notes (Signed)
  Subjective:  Patient ID: Billy Patterson, male    DOB: 1986-02-25,  MRN: [id number]  Plantar fasciitis on the right side was doing better but now it hurts again   35 y.o. male presents with the above complaint. History confirmed with patient.  Has a new issue of a painful ingrown toenail left hallux lateral border  Objective:  Physical Exam: warm, good capillary refill, no trophic changes or ulcerative lesions, normal DP and PT pulses, and normal sensory exam. Left foot: He has ingrown nail of the lateral border without paronychia Right Foot: Plantar fasciitis is resolved  Radiographs: X-ray of the right foot: no fracture, dislocation, swelling or degenerative changes noted and plantar calcaneal spur Assessment:   1. Ingrowing left great toenail       Plan:  Patient was evaluated and treated and all questions answered.    Ingrown Nail, left -Patient elects to proceed with minor surgery to remove ingrown toenail today. Consent reviewed and signed by patient. -Ingrown nail excised. See procedure note. -Educated on post-procedure care including soaking. Written instructions provided and reviewed. -Patient to follow up in 2 weeks for nail check.  Procedure: Excision of Ingrown Toenail Location: Left 1st toe lateral nail borders. Anesthesia: Lidocaine 1% plain; 1.5 mL and Marcaine 0.5% plain; 1.5 mL, digital block. Skin Prep: Betadine. Dressing: Silvadene; telfa; dry, sterile, compression dressing. Technique: Following skin prep, the toe was exsanguinated and a tourniquet was secured at the base of the toe. The affected nail border was freed, split with a nail splitter, and excised. Chemical matrixectomy was then performed with phenol and irrigated out with alcohol. The tourniquet was then removed and sterile dressing applied. Disposition: Patient tolerated procedure well. Patient to return in 2 weeks for follow-up.     Return in about 2 weeks (around 02/01/2021) for nail  re-check.   Ingrown toenail left foot

## 2021-02-03 ENCOUNTER — Ambulatory Visit: Payer: Commercial Managed Care - PPO | Admitting: Podiatry

## 2021-02-05 ENCOUNTER — Telehealth: Payer: Self-pay | Admitting: Internal Medicine

## 2021-02-05 NOTE — Telephone Encounter (Signed)
Pt called stating he had an allergic reaction from the waist up to some depositories he took for his hemorrhoid and he wants to know his best action to take regarding this situation. Pt has appt on Monday at 4pm

## 2021-02-05 NOTE — Telephone Encounter (Signed)
[facility] Day - Client TELEPHONE ADVICE RECORD AccessNurse Patient Name: Billy Patterson Billy Patterson Gender: Male DOB: [birth year] Age: 35 Y 7 M 28 D Return Phone Number: [phone] (Primary) Address: City/ State/ Zip: [place]  [address] Client [facility] - Client Client Site [facility] - Day Provider Tillman Abide- MD Contact Type Call Who Is Calling Patient / Member / Family / Caregiver Call Type Triage / Clinical Relationship To Patient Self Return Phone Number [phone] (Primary) Chief Complaint Hives Reason for Call Symptomatic / Request for Health Information Initial Comment Caller states he took supp for rectal symptoms, has hives all over, from his waist up to his neck. Translation No Nurse Assessment Nurse: Langston Masker, RN, Hospital doctor Date/Time (Billy Time): 02/05/2021 2:17:26 PM Confirm and document reason for call. If symptomatic, describe symptoms. ---Caller states he took suppository last night for rectal symptoms from hemorrhoids. States woke up this morning with hives all over from his waist up to his neck. States has some on face as well. States they are itchy. Does the patient have any new or worsening symptoms? ---Yes Will a triage be completed? ---Yes Related visit to physician within the last 2 weeks? ---Yes Does the PT have any chronic conditions? (i.e. diabetes, asthma, this includes High risk factors for pregnancy, etc.) ---No Is this a behavioral health or substance abuse call? ---No Guidelines Guideline Title Affirmed Question Affirmed Notes Nurse Date/Time (Billy Time) Hives [1] Widespread hives, itching or facial swelling AND [2] onset > 2 hours after exposure to highrisk allergen (e.g., sting, nuts, 1st dose of antibiotic) Morris, RN, [facility] 02/05/2021 2:19:31 PM Disp. Time Lamount Cohen Time) Disposition Final User PLEASE NOTE: All timestamps contained within this  report are represented as [place] Standard Time. CONFIDENTIALTY NOTICE: This fax transmission is intended only for the addressee. It contains information that is legally privileged, confidential or otherwise protected from use or disclosure. If you are not the intended recipient, you are strictly prohibited from reviewing, disclosing, copying using or disseminating any of this information or taking any action in reliance on or regarding this information. If you have received this fax in error, please notify us immediately by telephone so that we can arrange for its return to [place]. Phone: [phone], Toll-Free: [phone], Fax: [phone] Page: 2 of 2 Call Id: [id number] 02/05/2021 2:24:41 PM SEE PCP WITHIN 3 DAYS Yes Morris, RN, Control and instrumentation engineer Understands Yes PreDisposition Go to Urgent Care/Walk-In Clinic Care Advice Given Per Guideline SEE PCP WITHIN 3 DAYS: * You need to be seen within 2 or 3 days. TAKE A PHOTO OF THE RASH: * If you have a digital or cell phone camera, take a picture of the rash in case it changes or goes away. COOL BATH FOR ITCHING: * Take a cool bath for 10 minutes to relieve itching. (Caution: avoid any chill). ANTIHISTAMINE MEDICINES - EXTRA NOTES AND WARNINGS: * Antihistamine medicines can be used to treat allergic reactions, allergies, hay fever, hives, and itching. CALL BACK IF: ANTIHISTAMINE MEDICINES FOR WIDESPREAD HIVES: * Severe hives or severe itching persist over 24 hours despite taking an antihistamine (e.g., Benadryl) * You become worse CARE ADVICE given per Hives (Adult) guideline. Referrals GO TO FACILITY UNDECIDE

## 2021-02-05 NOTE — Telephone Encounter (Signed)
Spoke to pt. He bought OTC [facility] brand hemorrhoid suppositories and used last night. Woke up this am with hives on torso, neck, and face. He took 2 doses of Benadryl without much relief. No breathing issues. I suggested he try Zyrtec and/or famotidine. Will go to UC if he worsens. Will go to ER if he has breathing issues.Still plans to come to appt on Monday.

## 2021-02-07 NOTE — Telephone Encounter (Signed)
I don't see any urgent care notes---I will plan to review and make any treatment decisions at the visit on Monday

## 2021-02-08 ENCOUNTER — Other Ambulatory Visit: Payer: Self-pay

## 2021-02-08 ENCOUNTER — Encounter: Payer: Self-pay | Admitting: Internal Medicine

## 2021-02-08 ENCOUNTER — Ambulatory Visit: Payer: Commercial Managed Care - PPO | Admitting: Internal Medicine

## 2021-02-08 DIAGNOSIS — L519 Erythema multiforme, unspecified: Secondary | ICD-10-CM | POA: Diagnosis not present

## 2021-02-08 DIAGNOSIS — K640 First degree hemorrhoids: Secondary | ICD-10-CM | POA: Diagnosis not present

## 2021-02-08 DIAGNOSIS — K649 Unspecified hemorrhoids: Secondary | ICD-10-CM | POA: Insufficient documentation

## 2021-02-08 MED ORDER — HYDROCORTISONE 2.5 % EX CREA: TOPICAL_CREAM | Freq: Three times a day (TID) | CUTANEOUS | 3 refills | Status: DC | PRN

## 2021-02-08 NOTE — Assessment & Plan Note (Signed)
Will give 2.5% hydrocortisone cream for prn use

## 2021-02-08 NOTE — Progress Notes (Signed)
Subjective:    Patient ID: Billy Patterson, male    DOB: January 07, 1986, 35 y.o.   MRN: [id number]  HPI Here due to hemorrhoid problems Also urgent care follow up after allergic reaction to hemorrhoid treatmet This visit occurred during the SARS-CoV-2 public health emergency.  Safety protocols were in place, including screening questions prior to the visit, additional usage of staff PPE, and extensive cleaning of exam room while observing appropriate contact time as indicated for disinfecting solutions.   Past few weeks "have been hell for me" Went to urgent care 11/20--didn't feel well. Had the flu Finally feeling better by Thanksgiving  Then started with pain walking or sitting Pain moving bowels Blood on toilet paper and slightly in bowl Tried preparation H cream and suppository---then broke out in generalized rash (so stopped it) Seen at urgent care 2 days ago ([facility])----diagnosed with allergic reaction and given prednisone and loratadine/pepcid No oral lesion  Current Outpatient Medications on File Prior to Visit  Medication Sig Dispense Refill   fexofenadine (ALLEGRA) 180 MG tablet Take 180 mg by mouth daily.     meloxicam (MOBIC) 15 MG tablet TAKE 1 TABLET (15 MG TOTAL) BY MOUTH DAILY. 30 tablet 3   NEOMYCIN-POLYMYXIN-HYDROCORTISONE (CORTISPORIN) 1 % SOLN OTIC solution Apply to nail beds from procedure site twice daily after soaks 10 mL 0   predniSONE (DELTASONE) 20 MG tablet Take 40 mg by mouth daily.     terbinafine (LAMISIL) 250 MG tablet Take 1 tablet (250 mg total) by mouth daily. 90 tablet 0   Dexmethylphenidate HCl 35 MG CP24 Take 35 mg by mouth daily after breakfast. 30 capsule 0   No current facility-administered medications on file prior to visit.    Allergies  Allergen Reactions   Preparation H [Hydrocortisone] Hives    Past Medical History:  Diagnosis Date   ADHD (attention deficit hyperactivity disorder)    Allergy     History reviewed. No pertinent  surgical history.  Family History  Problem Relation Age of Onset   Heart disease Maternal Grandmother        cad   Diabetes Maternal Grandfather    Hypertension Father     Social History   Socioeconomic History   Marital status: Single    Spouse name: Not on file   Number of children: 0   Years of education: Not on file   Highest education level: Not on file  Occupational History   Occupation: Quality control     Comment: [employer]  Tobacco Use   Smoking status: Never   Smokeless tobacco: Never  Vaping Use   Vaping Use: Never used  Substance and Sexual Activity   Alcohol use: Yes    Alcohol/week: 2.0 - 9.0 standard drinks    Types: 2 - 9 Cans of beer per week    Comment: social   Drug use: No   Sexual activity: Not on file  Other Topics Concern   Not on file  Social History Narrative   Not on file   Social Determinants of Health   Financial Resource Strain: Not on file  Food Insecurity: Not on file  Transportation Needs: Not on file  Physical Activity: Not on file  Stress: Not on file  Social Connections: Not on file  Intimate Partner Violence: Not on file   Review of Systems Eating is okay ---other than with the flu Weight is fairly stable Bowels fairly regular--will strain at times (does go daily) No regular abdominal pain (unless he  has to hold his bowels and will get mild cramps till he can go) Did have some black stools over a year ago--no recurrence    Objective:   Physical Exam HENT:     Mouth/Throat:     Pharynx: No oropharyngeal exudate or posterior oropharyngeal erythema.     Comments: No oral lesions Genitourinary:    Comments: External hemorrhoid?----now with no blood Small internal hemorrhoid with some tenderness No blood now Lymphadenopathy:     Comments: Small right inguinal node--non tender (since the flu)  Skin:    Comments: Generalized rash on trunk and arms (coalesced like erythema multiforme like rash). Just scattered lesions on  legs           Assessment & Plan:

## 2021-02-08 NOTE — Assessment & Plan Note (Signed)
He will check which product Can finish out the prednisone and loratadine/pepcid

## 2021-02-15 ENCOUNTER — Ambulatory Visit: Payer: Commercial Managed Care - PPO | Admitting: Podiatry

## 2021-03-03 ENCOUNTER — Ambulatory Visit: Payer: Commercial Managed Care - PPO | Admitting: Podiatry

## 2021-03-03 ENCOUNTER — Other Ambulatory Visit: Payer: Self-pay

## 2021-03-03 DIAGNOSIS — L600 Ingrowing nail: Secondary | ICD-10-CM

## 2021-03-04 ENCOUNTER — Telehealth: Payer: Self-pay | Admitting: Internal Medicine

## 2021-03-04 NOTE — Telephone Encounter (Signed)
Pt called stating that medication hydrocortisone 2.5 % cream, is not working and would like something else called in. Please advise.

## 2021-03-04 NOTE — Telephone Encounter (Signed)
Please see 02-08-21 OV Note for information. Will forward to Dr Karle Starch inbox for [facility] to address.

## 2021-03-05 ENCOUNTER — Other Ambulatory Visit: Payer: Self-pay | Admitting: Family

## 2021-03-05 MED ORDER — HYDROCORTISONE ACETATE 25 MG RE SUPP: 25.0000 mg | Freq: Two times a day (BID) | RECTAL | 0 refills | Status: DC

## 2021-03-05 NOTE — Telephone Encounter (Signed)
Spoke to pt

## 2021-03-08 ENCOUNTER — Encounter: Payer: Self-pay | Admitting: Podiatry

## 2021-03-08 NOTE — Progress Notes (Signed)
°  Subjective:  Patient ID: Billy Patterson, male    DOB: 03-31-1985,  MRN: [id number]     36 y.o. male presents with the above complaint. History confirmed with patient.  He is doing well the right great toenail has started to hurt some and would like it checked  Objective:  Physical Exam: warm, good capillary refill, no trophic changes or ulcerative lesions, normal DP and PT pulses, and normal sensory exam. Left foot: He has ingrown nail of the lateral border without paronychia Right Foot: Plantar fasciitis is resolved, slight ingrown corner lateral nail fold  Radiographs: X-ray of the right foot: no fracture, dislocation, swelling or degenerative changes noted and plantar calcaneal spur Assessment:   1. Ingrowing left great toenail   2. Ingrowing right great toenail       Plan:  Patient was evaluated and treated and all questions answered.    Ingrown Nail, left -Doing well he can leave the toe open to air and discontinue soaks and ointment at this point.  I checked his right hallux as well and he had a mild ingrown corner and debrided this in a slant back fashion which alleviated the pain pressure.  He will return to see me as needed if it does not improve

## 2021-03-18 ENCOUNTER — Telehealth: Payer: Self-pay | Admitting: Internal Medicine

## 2021-03-18 ENCOUNTER — Other Ambulatory Visit: Payer: Self-pay | Admitting: Family

## 2021-03-18 DIAGNOSIS — K649 Unspecified hemorrhoids: Secondary | ICD-10-CM

## 2021-03-18 MED ORDER — HYDROCORTISONE 2.5 % EX CREA: TOPICAL_CREAM | Freq: Three times a day (TID) | CUTANEOUS | 3 refills | Status: DC | PRN

## 2021-03-18 NOTE — Telephone Encounter (Signed)
Referral placed.

## 2021-03-18 NOTE — Addendum Note (Signed)
Addended by: Eual Fines on: 03/18/2021 04:14 PM   Modules accepted: Orders

## 2021-03-18 NOTE — Telephone Encounter (Signed)
Rx sent electronically.  

## 2021-03-18 NOTE — Telephone Encounter (Signed)
°  Encourage patient to contact the pharmacy for refills or they can request refills through [facility]  LAST APPOINTMENT DATE:  Please schedule appointment if longer than 1 year  NEXT APPOINTMENT DATE:  MEDICATION: hydrocortisone (ANUSOL-HC) 25 MG suppository  Is the patient out of medication? yes  [facility]  Let patient know to contact pharmacy at the end of the day to make sure medication is ready.  Please notify patient to allow 48-72 hours to process  CLINICAL FILLS OUT ALL BELOW:   LAST REFILL:  QTY:  REFILL DATE:    OTHER COMMENTS:    Okay for refill?  Please advise

## 2021-03-18 NOTE — Telephone Encounter (Signed)
Patient called in stating his hemorrhoids are not getting better, he does have bleeding when he uses the bathroom, but not excessively and not every time  He would like to have a referral to GI  Patient is requesting  GI by [facility]  Saw Dr. Alphonsus Sias on 12.5.22 for this issue

## 2021-03-21 ENCOUNTER — Other Ambulatory Visit: Payer: Self-pay | Admitting: Family

## 2021-03-22 MED ORDER — HYDROCORTISONE ACETATE 25 MG RE SUPP: 25.0000 mg | Freq: Two times a day (BID) | RECTAL | 0 refills | Status: DC

## 2021-03-22 NOTE — Addendum Note (Signed)
Addended by: Eual Fines on: 03/22/2021 02:26 PM   Modules accepted: Orders

## 2021-03-22 NOTE — Telephone Encounter (Signed)
Rx sent electronically.  

## 2021-03-22 NOTE — Telephone Encounter (Signed)
Pt call in stated he got the hydrocortisone 2.5 % cream but would like the RX hydrocortisone (ANUSOL-HC) 25 MG suppository refill because it works better . Please advise [phone]

## 2021-05-04 ENCOUNTER — Other Ambulatory Visit: Payer: Self-pay | Admitting: Podiatry

## 2021-05-17 ENCOUNTER — Other Ambulatory Visit: Payer: Self-pay

## 2021-05-17 ENCOUNTER — Ambulatory Visit: Payer: Commercial Managed Care - PPO | Admitting: Gastroenterology

## 2021-05-17 ENCOUNTER — Encounter: Payer: Self-pay | Admitting: Gastroenterology

## 2021-05-17 VITALS — BP 124/87 | HR 92 | Temp 98.2°F | Ht 70.0 in | Wt 194.5 lb

## 2021-05-17 DIAGNOSIS — K6289 Other specified diseases of anus and rectum: Secondary | ICD-10-CM

## 2021-05-17 DIAGNOSIS — L290 Pruritus ani: Secondary | ICD-10-CM | POA: Diagnosis not present

## 2021-05-17 DIAGNOSIS — K625 Hemorrhage of anus and rectum: Secondary | ICD-10-CM

## 2021-05-17 NOTE — Progress Notes (Signed)
?  ?Billy Repress, MD ?[address]  ?[address]  ?[place], [place] [address]  ?Main: [phone]  ?Fax: [phone] ? ? ? ?Gastroenterology Consultation ? ?Referring Provider:     Karie Schwalbe, MD ?Primary Care Physician:  Billy Schwalbe, MD ?Primary Gastroenterologist:  Dr. Arlyss Patterson ?Reason for Consultation:     Rectal bleeding, discomfort, perianal itching ?      ? HPI:   ?Billy Patterson is a 36 y.o. male referred by Dr. Karie Schwalbe, MD  for consultation & management of rectal bleeding, discomfort, perianal itching.  Patient reports that since December, he has been experiencing bright red blood per rectum on wiping, which is intermittent associated with rectal pressure/swelling, perianal itching as well as seepage of mucus.  He has tried suppository which provided relief from internal hemorrhoids but his symptoms are persistent.  He denies any constipation or diarrhea.  He does report occasional straining and sensation of incomplete emptying.  He does not lift heavy weights.  He does not smoke or drink alcohol.  No known history of anemia ?Denies any abdominal surgeries ? ?NSAIDs: None ? ?Antiplts/Anticoagulants/Anti thrombotics: None ? ?GI Procedures: None ? ?Past Medical History:  ?Diagnosis Date  ? ADHD (attention deficit hyperactivity disorder)   ? Allergy   ? ? ?History reviewed. No pertinent surgical history. ? ? ?Current Outpatient Medications:  ?  Dexmethylphenidate HCl 35 MG CP24, Take 35 mg by mouth daily after breakfast., Disp: 30 capsule, Rfl: 0 ?  fexofenadine (ALLEGRA) 180 MG tablet, Take 180 mg by mouth daily., Disp: , Rfl:  ?  hydrocortisone (ANUSOL-HC) 25 MG suppository, Place 1 suppository (25 mg total) rectally 2 (two) times daily., Disp: 12 suppository, Rfl: 0 ?  meloxicam (MOBIC) 15 MG tablet, TAKE 1 TABLET (15 MG TOTAL) BY MOUTH DAILY., Disp: 30 tablet, Rfl: 3 ? ? ?Family History  ?Problem Relation Age of Onset  ? Heart disease Maternal Grandmother   ?     cad  ?  Diabetes Maternal Grandfather   ? Hypertension Father   ?  ? ?Social History  ? ?Tobacco Use  ? Smoking status: Never  ? Smokeless tobacco: Never  ?Vaping Use  ? Vaping Use: Never used  ?Substance Use Topics  ? Alcohol use: Yes  ?  Alcohol/week: 2.0 - 9.0 standard drinks  ?  Types: 2 - 9 Cans of beer per week  ?  Comment: social  ? Drug use: No  ? ? ?Allergies as of 05/17/2021 - Review Complete 05/17/2021  ?Allergen Reaction Noted  ? Preparation h [pramox-pe-glycerin-petrolatum] Hives 02/08/2021  ? ? ?Review of Systems:    ?All systems reviewed and negative except where noted in HPI. ? ? Physical Exam:  ?BP 124/87 (BP Location: Left Arm, Patient Position: Sitting, Cuff Size: Normal)   Pulse 92   Temp 98.2 ?F (36.8 ?C) (Oral)   Ht 5\' 10"  (1.778 m)   Wt 194 lb 8 oz (88.2 kg)   BMI 27.91 kg/m?  ?No LMP for male patient. ? ?General:   Alert,  Well-developed, well-nourished, pleasant and cooperative in NAD ?Head:  Normocephalic and atraumatic. ?Eyes:  Sclera clear, no icterus.   Conjunctiva pink. ?Ears:  Normal auditory acuity. ?Nose:  No deformity, discharge, or lesions. ?Mouth:  No deformity or lesions,oropharynx pink & moist. ?Neck:  Supple; no masses or thyromegaly. ?Lungs:  Respirations even and unlabored.  Clear throughout to auscultation.   No wheezes, crackles, or rhonchi. No acute distress. ?Heart:  Regular  rate and rhythm; no murmurs, clicks, rubs, or gallops. ?Abdomen:  Normal bowel sounds. Soft, non-tender and non-distended without masses, hepatosplenomegaly or hernias noted.  No guarding or rebound tenderness.   ?Rectal: Not performed ?Msk:  Symmetrical without gross deformities. Good, equal movement & strength bilaterally. ?Pulses:  Normal pulses noted. ?Extremities:  No clubbing or edema.  No cyanosis. ?Neurologic:  Alert and oriented x3;  grossly normal neurologically. ?Skin:  Intact without significant lesions or rashes. No jaundice. ?Psych:  Alert and cooperative. Normal mood and affect. ? ?Imaging  Studies: ?None ? ?Assessment and Plan:  ? ?Billy Patterson is a 36 y.o. male with 3 months history of bright red blood per rectum on wiping associated with rectal discomfort, perianal itching and mucus discharge per rectum.  Differentials include proctitis or rectal polyp or symptomatic external hemorrhoids or malignancy ? ?Recommend diagnostic colonoscopy with MiraLAX prep.  If unremarkable, discussed about outpatient hemorrhoid ligation and patient is agreeable with the plan ? ?I have discussed alternative options, risks & benefits,  which include, but are not limited to, bleeding, infection, perforation,respiratory complication & drug reaction.  The patient agrees with this plan & written consent will be obtained.   ? ? ?Follow up based on the colonoscopy results ? ? ?Billy Repress, MD ? ?

## 2021-05-24 ENCOUNTER — Encounter: Payer: Self-pay | Admitting: Gastroenterology

## 2021-05-26 ENCOUNTER — Telehealth: Payer: Self-pay | Admitting: Gastroenterology

## 2021-05-26 NOTE — Telephone Encounter (Signed)
Patient mother was making sure he needed to do the Miralax prep and not a presecretion prep. Informed patient mom yes that is what Dr. Allegra Lai wanted him to do  ?

## 2021-05-26 NOTE — Telephone Encounter (Signed)
Patients mother called with questions about colonoscopy, also wants to make sure that no instructions were misunderstood and that everything was done right. Requesting call back. ?

## 2021-05-27 ENCOUNTER — Encounter
Admission: RE | Disposition: A | Discharge status: Home / Self Care | Payer: Self-pay | Source: Ambulatory Visit | Attending: Gastroenterology

## 2021-05-27 ENCOUNTER — Ambulatory Visit: Payer: Commercial Managed Care - PPO | Admitting: Anesthesiology

## 2021-05-27 ENCOUNTER — Encounter: Payer: Self-pay | Admitting: Gastroenterology

## 2021-05-27 ENCOUNTER — Other Ambulatory Visit: Payer: Self-pay

## 2021-05-27 ENCOUNTER — Ambulatory Visit
Admission: RE | Admit: 2021-05-27 | Discharge: 2021-05-27 | Disposition: A | Discharge status: Home / Self Care | Payer: Commercial Managed Care - PPO | Source: Ambulatory Visit | Attending: Gastroenterology | Admitting: Gastroenterology

## 2021-05-27 DIAGNOSIS — Z791 Long term (current) use of non-steroidal anti-inflammatories (NSAID): Secondary | ICD-10-CM | POA: Insufficient documentation

## 2021-05-27 DIAGNOSIS — K625 Hemorrhage of anus and rectum: Secondary | ICD-10-CM

## 2021-05-27 DIAGNOSIS — Z79899 Other long term (current) drug therapy: Secondary | ICD-10-CM | POA: Insufficient documentation

## 2021-05-27 DIAGNOSIS — K626 Ulcer of anus and rectum: Secondary | ICD-10-CM

## 2021-05-27 DIAGNOSIS — K610 Anal abscess: Secondary | ICD-10-CM

## 2021-05-27 DIAGNOSIS — F909 Attention-deficit hyperactivity disorder, unspecified type: Secondary | ICD-10-CM | POA: Insufficient documentation

## 2021-05-27 HISTORY — PX: COLONOSCOPY WITH PROPOFOL: SHX5780

## 2021-05-27 SURGERY — COLONOSCOPY WITH PROPOFOL
Anesthesia: General | Site: Rectum

## 2021-05-27 MED ORDER — LACTATED RINGERS IV SOLN: INTRAVENOUS | Status: DC

## 2021-05-27 MED ORDER — PROPOFOL 500 MG/50ML IV EMUL
INTRAVENOUS | Status: DC | PRN
  Administered 2021-05-27: 140 ug/kg/min via INTRAVENOUS

## 2021-05-27 MED ORDER — METRONIDAZOLE 500 MG PO TABS: 500.0000 mg | ORAL_TABLET | Freq: Two times a day (BID) | ORAL | 0 refills | Status: AC

## 2021-05-27 MED ORDER — CIPROFLOXACIN HCL 500 MG PO TABS: 500.0000 mg | ORAL_TABLET | Freq: Two times a day (BID) | ORAL | 0 refills | Status: AC

## 2021-05-27 MED ORDER — SODIUM CHLORIDE 0.9 % IV SOLN: INTRAVENOUS | Status: DC

## 2021-05-27 MED ORDER — ONDANSETRON HCL 4 MG/2ML IJ SOLN: 4.0000 mg | Freq: Once | INTRAMUSCULAR | Status: DC | PRN

## 2021-05-27 MED ORDER — ACETAMINOPHEN 325 MG PO TABS: 325.0000 mg | ORAL_TABLET | ORAL | Status: DC | PRN

## 2021-05-27 MED ORDER — LIDOCAINE HCL (CARDIAC) PF 100 MG/5ML IV SOSY
PREFILLED_SYRINGE | INTRAVENOUS | Status: DC | PRN
Start: 2021-05-27 — End: 2021-05-27
  Administered 2021-05-27: 60 mg via INTRAVENOUS

## 2021-05-27 MED ORDER — ACETAMINOPHEN 160 MG/5ML PO SOLN: 325.0000 mg | ORAL | Status: DC | PRN

## 2021-05-27 MED ORDER — PROPOFOL 10 MG/ML IV BOLUS
INTRAVENOUS | Status: DC | PRN
  Administered 2021-05-27: 80 mg via INTRAVENOUS
  Administered 2021-05-27: 20 mg via INTRAVENOUS

## 2021-05-27 MED ORDER — DEXMEDETOMIDINE (PRECEDEX) IN NS 20 MCG/5ML (4 MCG/ML) IV SYRINGE
PREFILLED_SYRINGE | INTRAVENOUS | Status: DC | PRN
  Administered 2021-05-27 (×2): 5 ug via INTRAVENOUS

## 2021-05-27 MED ORDER — STERILE WATER FOR IRRIGATION IR SOLN
Status: DC | PRN
  Administered 2021-05-27: 250 mL

## 2021-05-27 SURGICAL SUPPLY — 26 items
CLIP HMST 235XBRD CATH ROT (MISCELLANEOUS) IMPLANT
CLIP RESOLUTION 360 11X235 (MISCELLANEOUS)
ELECT REM PT RETURN 9FT ADLT (ELECTROSURGICAL)
ELECTRODE REM PT RTRN 9FT ADLT (ELECTROSURGICAL) IMPLANT
FCP ESCP3.2XJMB 240X2.8X (MISCELLANEOUS)
FORCEPS BIOP RAD 4 LRG CAP 4 (CUTTING FORCEPS) ×1 IMPLANT
FORCEPS BIOP RJ4 240 W/NDL (MISCELLANEOUS)
FORCEPS ESCP3.2XJMB 240X2.8X (MISCELLANEOUS) IMPLANT
GOWN CVR UNV OPN BCK APRN NK (MISCELLANEOUS) ×4 IMPLANT
GOWN ISOL THUMB LOOP REG UNIV (MISCELLANEOUS) ×4
INJECTOR VARIJECT VIN23 (MISCELLANEOUS) IMPLANT
KIT DEFENDO VALVE AND CONN (KITS) IMPLANT
KIT PRC NS LF DISP ENDO (KITS) ×2 IMPLANT
KIT PROCEDURE OLYMPUS (KITS) ×2
MANIFOLD NEPTUNE II (INSTRUMENTS) ×3 IMPLANT
MARKER SPOT ENDO TATTOO 5ML (MISCELLANEOUS) IMPLANT
PROBE APC STR FIRE (PROBE) IMPLANT
RETRIEVER NET ROTH 2.5X230 LF (MISCELLANEOUS) IMPLANT
SNARE COLD EXACTO (MISCELLANEOUS) IMPLANT
SNARE SHORT THROW 13M SML OVAL (MISCELLANEOUS) IMPLANT
SNARE SHORT THROW 30M LRG OVAL (MISCELLANEOUS) IMPLANT
SNARE SNG USE RND 15MM (INSTRUMENTS) IMPLANT
SPOT EX ENDOSCOPIC TATTOO (MISCELLANEOUS)
TRAP ETRAP POLY (MISCELLANEOUS) IMPLANT
VARIJECT INJECTOR VIN23 (MISCELLANEOUS)
WATER STERILE IRR 250ML POUR (IV SOLUTION) ×3 IMPLANT

## 2021-05-27 NOTE — Op Note (Signed)
[facility] ?Gastroenterology ?Patient Name: Billy Patterson ?Procedure Date: 05/27/2021 9:06 AM ?MRN: [id number] ?Account #: [id number] ?Date of Birth: 1985/06/13 ?Admit Type: Outpatient ?Age: 36 ?Room: [facility] OR ROOM 01 ?Gender: Male ?Note Status: Finalized ?Instrument Name: [id number] ?Procedure:             Colonoscopy ?Indications:           Rectal bleeding ?Providers:             Toney Reil MD, MD ?Referring MD:          Karie Schwalbe (Referring MD) ?Medicines:             General Anesthesia ?Complications:         No immediate complications. Estimated blood loss: None. ?Procedure:             Pre-Anesthesia Assessment: ?                       - Prior to the procedure, a History and Physical was  ?                       performed, and patient medications and allergies were  ?                       reviewed. The patient is competent. The risks and  ?                       benefits of the procedure and the sedation options and  ?                       risks were discussed with the patient. All questions  ?                       were answered and informed consent was obtained.  ?                       Patient identification and proposed procedure were  ?                       verified by the physician, the nurse, the  ?                       anesthesiologist, the anesthetist and the technician  ?                       in the pre-procedure area in the procedure room in the  ?                       endoscopy suite. Mental Status Examination: alert and  ?                       oriented. Airway Examination: normal oropharyngeal  ?                       airway and neck mobility. Respiratory Examination:  ?                       clear to auscultation. CV Examination: normal.  ?  Prophylactic Antibiotics: The patient does not require  ?                       prophylactic antibiotics. Prior Anticoagulants: The  ?                       patient has taken no previous anticoagulant  or  ?                       antiplatelet agents. ASA Grade Assessment: II - A  ?                       patient with mild systemic disease. After reviewing  ?                       the risks and benefits, the patient was deemed in  ?                       satisfactory condition to undergo the procedure. The  ?                       anesthesia plan was to use general anesthesia.  ?                       Immediately prior to administration of medications,  ?                       the patient was re-assessed for adequacy to receive  ?                       sedatives. The heart rate, respiratory rate, oxygen  ?                       saturations, blood pressure, adequacy of pulmonary  ?                       ventilation, and response to care were monitored  ?                       throughout the procedure. The physical status of the  ?                       patient was re-assessed after the procedure. ?                       After obtaining informed consent, the colonoscope was  ?                       passed under direct vision. Throughout the procedure,  ?                       the patient's blood pressure, pulse, and oxygen  ?                       saturations were monitored continuously. The  ?                       Colonoscope was introduced through the anus and  ?  advanced to the 10 cm into the ileum. The colonoscopy  ?                       was performed without difficulty. The patient  ?                       tolerated the procedure well. The quality of the bowel  ?                       preparation was fair. ?Findings: ?     The perianal exam findings include perianal abscess. ?     The terminal ileum appeared normal. ?     Normal mucosa was found in the entire colon. Biopsies were taken with a  ?     cold forceps for histology. ?     A single (solitary) ten mm ulcer was found in the distal rectum. No  ?     bleeding was present. No stigmata of recent bleeding were seen. ?     A patchy area  of mildly erythematous mucosa was found in the rectum.  ?     Biopsies were taken with a cold forceps for histology. ?Impression:            - Preparation of the colon was fair. ?                       - Perianal abscess found on perianal exam. ?                       - The examined portion of the ileum was normal. ?                       - Normal mucosa in the entire examined colon. Biopsied. ?                       - A single (solitary) ulcer in the distal rectum. ?                       - Erythematous mucosa in the rectum. Biopsied. ?Recommendation:        - Discharge patient to home (with escort). ?                       - Resume previous diet today. ?                       - Continue present medications. ?                       - Await pathology results. ?                       - Cipro (ciprofloxacin) 500 mg PO BID for 7 weeks. ?                       - Flagyl (metronidazole) 500 mg PO BID for 7 weeks. ?                       - Return to my office at appointment to be scheduled. ?Procedure Code(s):     --- Professional --- ?  16109, Colonoscopy, flexible; with biopsy, single or  ?                       multiple ?Diagnosis Code(s):     --- Professional --- ?                       K61.0, Anal abscess ?                       K62.6, Ulcer of anus and rectum ?                       K62.89, Other specified diseases of anus and rectum ?                       K62.5, Hemorrhage of anus and rectum ?CPT copyright 2019 American Medical Association. All rights reserved. ?The codes documented in this report are preliminary and upon coder review may  ?be revised to meet current compliance requirements. ?Dr. Libby Maw ?Sayyid Harewood Alger Memos MD, MD ?05/27/2021 9:42:00 AM ?This report has been signed electronically. ?Number of Addenda: 0 ?Note Initiated On: 05/27/2021 9:06 AM ?Scope Withdrawal Time: 0 hours 13 minutes 17 seconds  ?Total Procedure Duration: 0 hours 15 minutes 56 seconds  ?Estimated Blood Loss:   Estimated blood loss: none. ?     [facility] ?

## 2021-05-27 NOTE — Anesthesia Procedure Notes (Signed)
Date/Time: 05/27/2021 9:16 AM ?Performed by: Lily Kocher, CRNA ?Pre-anesthesia Checklist: Patient identified, Emergency Drugs available, Suction available, Patient being monitored and Timeout performed ?Patient Re-evaluated:Patient Re-evaluated prior to induction ?Oxygen Delivery Method: Nasal cannula ?Induction Type: IV induction ?Placement Confirmation: positive ETCO2 ? ? ? ? ?

## 2021-05-27 NOTE — Anesthesia Postprocedure Evaluation (Signed)
Anesthesia Post Note ? ?Patient: Billy Patterson ? ?Procedure(s) Performed: COLONOSCOPY WITH PROPOFOL (Rectum) ? ? ?  ?Patient location during evaluation: PACU ?Anesthesia Type: General ?Level of consciousness: awake ?Pain management: pain level controlled ?Vital Signs Assessment: post-procedure vital signs reviewed and stable ?Respiratory status: respiratory function stable ?Cardiovascular status: stable ?Postop Assessment: no signs of nausea or vomiting ?Anesthetic complications: no ? ? ?No notable events documented. ? ?Jola Babinski ? ? ? ? ? ?

## 2021-05-27 NOTE — H&P (Signed)
?Arlyss Repress, MD ?[address]  ?[address]  ?[place], [place] [address]  ?Main: [phone]  ?Fax: [phone] ?Pager: [phone] ? ?Primary Care Physician:  Karie Schwalbe, MD ?Primary Gastroenterologist:  Dr. Arlyss Repress ? ?Pre-Procedure History & Physical: ?HPI:  Billy Patterson is a 36 y.o. male is here for an colonoscopy. ?  ?Past Medical History:  ?Diagnosis Date  ? ADHD (attention deficit hyperactivity disorder)   ? Allergy   ? ? ?Past Surgical History:  ?Procedure Laterality Date  ? DENTAL SURGERY    ? ? ?Prior to Admission medications   ?Medication Sig Start Date End Date Taking? Authorizing Provider  ?Dexmethylphenidate HCl 35 MG CP24 Take 35 mg by mouth daily after breakfast. 04/24/20 05/27/21 Yes Chauncey Mann, MD  ?fexofenadine (ALLEGRA) 180 MG tablet Take 180 mg by mouth daily.   Yes [provider]  ?meloxicam (MOBIC) 15 MG tablet TAKE 1 TABLET (15 MG TOTAL) BY MOUTH DAILY. 05/05/21  Yes McDonald, Rachelle Hora, DPM  ?hydrocortisone (ANUSOL-HC) 25 MG suppository Place 1 suppository (25 mg total) rectally 2 (two) times daily. ?Patient not taking: Reported on 05/17/2021 03/22/21   Karie Schwalbe, MD  ? ? ?Allergies as of 05/17/2021 - Review Complete 05/17/2021  ?Allergen Reaction Noted  ? Preparation h [pramox-pe-glycerin-petrolatum] Hives 02/08/2021  ? ? ?Family History  ?Problem Relation Age of Onset  ? Heart disease Maternal Grandmother   ?     cad  ? Diabetes Maternal Grandfather   ? Hypertension Father   ? ? ?Social History  ? ?Socioeconomic History  ? Marital status: Single  ?  Spouse name: Not on file  ? Number of children: 0  ? Years of education: Not on file  ? Highest education level: Not on file  ?Occupational History  ? Occupation: Quality control   ?  Comment: [employer]  ?Tobacco Use  ? Smoking status: Never  ? Smokeless tobacco: Never  ?Vaping Use  ? Vaping Use: Never used  ?Substance and Sexual Activity  ? Alcohol use: Yes  ?  Alcohol/week: 2.0 - 9.0 standard drinks  ?   Types: 2 - 9 Cans of beer per week  ?  Comment: social  ? Drug use: No  ? Sexual activity: Not on file  ?Other Topics Concern  ? Not on file  ?Social History Narrative  ? Not on file  ? ?Social Determinants of Health  ? ?Financial Resource Strain: Not on file  ?Food Insecurity: Not on file  ?Transportation Needs: Not on file  ?Physical Activity: Not on file  ?Stress: Not on file  ?Social Connections: Not on file  ?Intimate Partner Violence: Not on file  ? ? ?Review of Systems: ?See HPI, otherwise negative ROS ? ?Physical Exam: ?BP 126/84   Pulse 91   Temp 98.4 ?F (36.9 ?C) (Temporal)   Resp 20   Ht 5\' 10"  (1.778 m)   Wt 83.9 kg   SpO2 98%   BMI 26.54 kg/m?  ?General:   Alert,  pleasant and cooperative in NAD ?Head:  Normocephalic and atraumatic. ?Neck:  Supple; no masses or thyromegaly. ?Lungs:  Clear throughout to auscultation.    ?Heart:  Regular rate and rhythm. ?Abdomen:  Soft, nontender and nondistended. Normal bowel sounds, without guarding, and without rebound.   ?Neurologic:  Alert and  oriented x4;  grossly normal neurologically. ? ?Impression/Plan: ?Billy is here for an colonoscopy to be performed for rectal bleeding ? ?Risks, benefits, limitations, and alternatives regarding  colonoscopy have been reviewed with the patient.  Questions have been answered.  All parties agreeable. ? ? ?Lannette Donath, MD  05/27/2021, 8:35 AM ?

## 2021-05-27 NOTE — Anesthesia Preprocedure Evaluation (Signed)
Anesthesia Evaluation  ?Patient identified by MRN, date of birth, ID band ?Patient awake ? ? ? ?Reviewed: ?Allergy & Precautions, NPO status  ? ?Airway ?Mallampati: II ? ?TM Distance: >3 FB ? ? ? ? Dental ?  ?Pulmonary ?neg pulmonary ROS,  ?  ?Pulmonary exam normal ? ? ? ? ? ? ? Cardiovascular ?negative cardio ROS ? ? ?Rhythm:Regular Rate:Normal ? ? ?  ?Neuro/Psych ?PSYCHIATRIC DISORDERS (ADHD)   ? GI/Hepatic ?Bowel prep,Rectal bleeding ?  ?Endo/Other  ? ? Renal/GU ?  ? ?  ?Musculoskeletal ? ? Abdominal ?  ?Peds ? Hematology ?  ?Anesthesia Other Findings ? ? Reproductive/Obstetrics ? ?  ? ? ? ? ? ? ? ? ? ? ? ? ? ?  ?  ? ? ? ? ? ? ? ? ?Anesthesia Physical ?Anesthesia Plan ? ?ASA: 2 ? ?Anesthesia Plan: General  ? ?Post-op Pain Management:   ? ?Induction: Intravenous ? ?PONV Risk Score and Plan: 2 and Propofol infusion, TIVA and Treatment may vary due to age or medical condition ? ?Airway Management Planned: Natural Airway and Nasal Cannula ? ?Additional Equipment:  ? ?Intra-op Plan:  ? ?Post-operative Plan:  ? ?Informed Consent: I have reviewed the patients History and Physical, chart, labs and discussed the procedure including the risks, benefits and alternatives for the proposed anesthesia with the patient or authorized representative who has indicated his/her understanding and acceptance.  ? ? ? ? ? ?Plan Discussed with: CRNA ? ?Anesthesia Plan Comments:   ? ? ? ? ? ? ?Anesthesia Quick Evaluation ? ?

## 2021-05-27 NOTE — Transfer of Care (Signed)
Immediate Anesthesia Transfer of Care Note ? ?Patient: Billy Patterson ? ?Procedure(s) Performed: COLONOSCOPY WITH PROPOFOL (Rectum) ? ?Patient Location: PACU ? ?Anesthesia Type: General ? ?Level of Consciousness: awake, alert  and patient cooperative ? ?Airway and Oxygen Therapy: Patient Spontanous Breathing and Patient connected to supplemental oxygen ? ?Post-op Assessment: Post-op Vital signs reviewed, Patient's Cardiovascular Status Stable, Respiratory Function Stable, Patent Airway and No signs of Nausea or vomiting ? ?Post-op Vital Signs: Reviewed and stable ? ?Complications: No notable events documented. ? ?

## 2021-05-28 ENCOUNTER — Encounter: Payer: Self-pay | Admitting: Gastroenterology

## 2021-05-31 LAB — SURGICAL PATHOLOGY

## 2021-06-01 ENCOUNTER — Other Ambulatory Visit: Payer: Self-pay | Admitting: Gastroenterology

## 2021-06-01 ENCOUNTER — Encounter: Payer: Self-pay | Admitting: Gastroenterology

## 2021-06-01 DIAGNOSIS — K6289 Other specified diseases of anus and rectum: Secondary | ICD-10-CM

## 2021-06-01 MED ORDER — MESALAMINE 1000 MG RE SUPP: 1000.0000 mg | Freq: Every day | RECTAL | 0 refills | Status: DC

## 2021-10-27 ENCOUNTER — Ambulatory Visit: Payer: Commercial Managed Care - PPO | Admitting: Family

## 2021-10-27 ENCOUNTER — Encounter: Payer: Self-pay | Admitting: Family

## 2021-10-27 VITALS — BP 129/88 | HR 76 | Temp 98.3°F | Ht 70.0 in | Wt 202.1 lb

## 2021-10-27 DIAGNOSIS — M545 Low back pain, unspecified: Secondary | ICD-10-CM | POA: Diagnosis not present

## 2021-10-27 LAB — POCT URINALYSIS DIPSTICK
Bilirubin, UA: NEGATIVE
Glucose, UA: NEGATIVE
Ketones, UA: NEGATIVE
Leukocytes, UA: NEGATIVE
Nitrite, UA: NEGATIVE
Protein, UA: NEGATIVE
Spec Grav, UA: >=1.03 — AB (ref 1.010–1.025)
Urobilinogen, UA: 0.2 E.U./dL
pH, UA: 6 (ref 5.0–8.0)

## 2021-10-27 MED ORDER — KETOROLAC TROMETHAMINE 10 MG PO TABS: 10.0000 mg | ORAL_TABLET | ORAL | 0 refills | Status: DC

## 2021-10-27 MED ORDER — KETOROLAC TROMETHAMINE 60 MG/2ML IM SOLN
60.0000 mg | Freq: Once | INTRAMUSCULAR | Status: AC
  Administered 2021-10-27: 60 mg via INTRAMUSCULAR

## 2021-10-27 MED ORDER — TAMSULOSIN HCL 0.4 MG PO CAPS
0.4000 mg | ORAL_CAPSULE | ORAL | 0 refills | Status: DC
Start: 2021-10-27 — End: 2022-09-29

## 2021-10-27 NOTE — Progress Notes (Signed)
Patient ID: Billy Patterson, male    DOB: [birth year], 36 y.o.   MRN: [id number]  Chief Complaint  Patient presents with   Back Pain    Pt c/o lower back pain in lower back pains in kidney area. Has tried tylenol which did not help pain.     HPI: Low back pain:  right side off and on for last 2 weeks, sharper pains started today on right side lasts for a few minutes then goes away, and then returns. Reports dark urine when pain started 2w ago. Denies any heavy lifting or straining more than usual recently.   Assessment & Plan:  1. Acute right-sided low back pain without sciatica positive costovertebral angle tenderness, right side. Mod blood in urine, high sp grav., pt reports he does not drink enough water. Giving Toradol injection and sending pills for pain along with Flomax, advised pt on use & SE, sending Urology referral, advised pt to sch appt, can cancel if stone is excreted or pain goes away.  - POCT Urinalysis Dipstick - ketorolac (TORADOL) injection 60 mg - tamsulosin (FLOMAX) 0.4 MG CAPS capsule; Take 1 capsule (0.4 mg total) by mouth as directed. Take 1 capsule bid x 3d, then 1 cap daily  Dispense: 10 capsule; Refill: 0 - ketorolac (TORADOL) 10 MG tablet; Take 1 tablet (10 mg total) by mouth as directed. Take 2 pills first dose; Then 1 pill every 6h prn, max dose of 4 pills in 24h  Dispense: 20 tablet; Refill: 0 - Ambulatory referral to Urology   Subjective:    Outpatient Medications Prior to Visit  Medication Sig Dispense Refill   fexofenadine (ALLEGRA) 180 MG tablet Take 180 mg by mouth daily.     meloxicam (MOBIC) 15 MG tablet TAKE 1 TABLET (15 MG TOTAL) BY MOUTH DAILY. 30 tablet 3   hydrocortisone (ANUSOL-HC) 25 MG suppository Place 1 suppository (25 mg total) rectally 2 (two) times daily. 12 suppository 0   Dexmethylphenidate HCl 35 MG CP24 Take 35 mg by mouth daily after breakfast. 30 capsule 0   mesalamine (CANASA) 1000 MG suppository Place 1 suppository (1,000 mg  total) rectally at bedtime for 14 days. 14 suppository 0   No facility-administered medications prior to visit.   Past Medical History:  Diagnosis Date   ADHD (attention deficit hyperactivity disorder)    Allergy    Past Surgical History:  Procedure Laterality Date   COLONOSCOPY WITH PROPOFOL N/A 05/27/2021   Procedure: COLONOSCOPY WITH PROPOFOL;  Surgeon: Toney Reil, MD;  Location: [facility];  Service: Endoscopy;  Laterality: N/A;   DENTAL SURGERY     Allergies  Allergen Reactions   Preparation H [Pramox-Pe-Glycerin-Petrolatum] Hives    Not sure which product       Objective:    Physical Exam Vitals and nursing note reviewed.  Constitutional:      General: He is not in acute distress.    Appearance: Normal appearance.  HENT:     Head: Normocephalic.  Cardiovascular:     Rate and Rhythm: Normal rate and regular rhythm.  Pulmonary:     Effort: Pulmonary effort is normal.     Breath sounds: Normal breath sounds.  Abdominal:     Tenderness: There is right CVA tenderness.  Musculoskeletal:        General: Normal range of motion.     Cervical back: Normal range of motion.  Skin:    General: Skin is warm and dry.  Neurological:  Mental Status: He is alert and oriented to person, place, and time.  Psychiatric:        Mood and Affect: Mood normal.    BP 129/88 (BP Location: Left Arm, Patient Position: Sitting, Cuff Size: Large)   Pulse 76   Temp 98.3 F (36.8 C) (Temporal)   Ht 5\' 10"  (1.778 m)   Wt 202 lb 2 oz (91.7 kg)   SpO2 97%   BMI 29.00 kg/m  Wt Readings from Last 3 Encounters:  10/27/21 202 lb 2 oz (91.7 kg)  05/27/21 185 lb (83.9 kg)  05/17/21 194 lb 8 oz (88.2 kg)       05/19/21, NP

## 2021-10-27 NOTE — Patient Instructions (Signed)
It was very nice to see you today!   I have sent over medication, Ketorolac for the pain in your side. Take as directed on the bottle. DO NOT take Meloxicam (Mobic) while you are taking the Ketorolac.  I also sent over Tamsulosin capsules which will help dilate your ureters so that  if there is a stone, it will pass on through easier.   I have sent a referral to Urology. Go ahead and make an appointment when they call, you can always cancel if your pain goes away, and/or you pass the stone.  Drink at least 2 Liters = 8 cups of water every day for best kidney health!      PLEASE NOTE:  If you had any lab tests please let us know if you have not heard back within a few days. You may see your results on MyChart before we have a chance to review them but we will give you a call once they are reviewed by [place]. If we ordered any referrals today, please let us know if you have not heard from their office within the next week.

## 2021-12-23 ENCOUNTER — Encounter: Payer: Self-pay | Admitting: Emergency Medicine

## 2021-12-23 ENCOUNTER — Emergency Department
Admission: EM | Admit: 2021-12-23 | Discharge: 2021-12-23 | Disposition: A | Discharge status: Home / Self Care | Payer: Commercial Managed Care - PPO | Attending: Emergency Medicine | Admitting: Emergency Medicine

## 2021-12-23 ENCOUNTER — Other Ambulatory Visit: Payer: Self-pay

## 2021-12-23 ENCOUNTER — Emergency Department: Payer: Commercial Managed Care - PPO

## 2021-12-23 DIAGNOSIS — N23 Unspecified renal colic: Secondary | ICD-10-CM | POA: Insufficient documentation

## 2021-12-23 DIAGNOSIS — R1031 Right lower quadrant pain: Secondary | ICD-10-CM | POA: Diagnosis present

## 2021-12-23 HISTORY — DX: Calculus of kidney: N20.0

## 2021-12-23 LAB — CBC WITH DIFFERENTIAL/PLATELET
Abs Immature Granulocytes: 0.05 10*3/uL (ref 0.00–0.07)
Basophils Absolute: 0 10*3/uL (ref 0.0–0.1)
Basophils Relative: 0 %
Eosinophils Absolute: 0 10*3/uL (ref 0.0–0.5)
Eosinophils Relative: 0 %
HCT: 43.4 % (ref 39.0–52.0)
Hemoglobin: 15.7 g/dL (ref 13.0–17.0)
Immature Granulocytes: 0 %
Lymphocytes Relative: 9 %
Lymphs Abs: 1.2 10*3/uL (ref 0.7–4.0)
MCH: 32.7 pg (ref 26.0–34.0)
MCHC: 36.2 g/dL — ABNORMAL HIGH (ref 30.0–36.0)
MCV: 90.4 fL (ref 80.0–100.0)
Monocytes Absolute: 0.7 10*3/uL (ref 0.1–1.0)
Monocytes Relative: 5 %
Neutro Abs: 12.2 10*3/uL — ABNORMAL HIGH (ref 1.7–7.7)
Neutrophils Relative %: 86 %
Platelets: 247 10*3/uL (ref 150–400)
RBC: 4.8 MIL/uL (ref 4.22–5.81)
RDW: 12.2 % (ref 11.5–15.5)
WBC: 14.1 10*3/uL — ABNORMAL HIGH (ref 4.0–10.5)
nRBC: 0 % (ref 0.0–0.2)

## 2021-12-23 LAB — URINALYSIS, ROUTINE W REFLEX MICROSCOPIC
Bacteria, UA: NONE SEEN
Bilirubin Urine: NEGATIVE
Glucose, UA: NEGATIVE mg/dL
Ketones, ur: 5 mg/dL — AB
Nitrite: NEGATIVE
Protein, ur: NEGATIVE mg/dL
Specific Gravity, Urine: 1.027 (ref 1.005–1.030)
pH: 5 (ref 5.0–8.0)

## 2021-12-23 LAB — COMPREHENSIVE METABOLIC PANEL
ALT: 36 U/L (ref 0–44)
AST: 29 U/L (ref 15–41)
Albumin: 4.5 g/dL (ref 3.5–5.0)
Alkaline Phosphatase: 74 U/L (ref 38–126)
Anion gap: 8 (ref 5–15)
BUN: 24 mg/dL — ABNORMAL HIGH (ref 6–20)
CO2: 26 mmol/L (ref 22–32)
Calcium: 8.4 mg/dL — ABNORMAL LOW (ref 8.9–10.3)
Chloride: 100 mmol/L (ref 98–111)
Creatinine, Ser: 1.31 mg/dL — ABNORMAL HIGH (ref 0.61–1.24)
GFR, Estimated: >60 mL/min (ref >60)
Glucose, Bld: 116 mg/dL — ABNORMAL HIGH (ref 70–99)
Potassium: 3.6 mmol/L (ref 3.5–5.1)
Sodium: 134 mmol/L — ABNORMAL LOW (ref 135–145)
Total Bilirubin: 2.4 mg/dL — ABNORMAL HIGH (ref 0.3–1.2)
Total Protein: 7.5 g/dL (ref 6.5–8.1)

## 2021-12-23 MED ORDER — KETOROLAC TROMETHAMINE 15 MG/ML IJ SOLN
15.0000 mg | Freq: Once | INTRAMUSCULAR | Status: AC
  Administered 2021-12-23: 15 mg via INTRAMUSCULAR
  Filled 2021-12-23: qty 1

## 2021-12-23 NOTE — Discharge Instructions (Addendum)
Please make sure you are drinking plenty of water.  Avoid the sweet tea and soft drinks.  Please return for any increasing pain or fever or vomiting.  See the urologist, Dr. Bernardo Heater, call the office in the morning let him know you had your second kidney stone and ask if he can see you and make sure the stone has passed.  It probably has.  I would strain all of your urine and by 2 can catch the stone and take it to him and see if he thinks it will be worthwhile analyzing it.

## 2021-12-23 NOTE — ED Triage Notes (Signed)
Patient ambulatory to triage with steady gait, without difficulty or distress noted; pt reports since 1am having rt flank/side pain with no accomp symptoms; st hx kidney stones

## 2021-12-23 NOTE — ED Provider Notes (Signed)
[facility] Provider Note    None    (approximate)   History   Flank Pain   HPI  Billy Patterson is a 36 y.o. male who reports he had onset of what he thought was a kidney stone pain at about 1:00 this morning.  Right flank pain.  No history of fever and no other problems.  It has since resolved.      Physical Exam   Triage Vital Signs: ED Triage Vitals  Enc Vitals Group     BP 12/23/21 0448 (!) 144/108     Pulse Rate 12/23/21 0448 66     Resp 12/23/21 0448 20     Temp 12/23/21 0448 97.9 F (36.6 C)     Temp Source 12/23/21 0448 Oral     SpO2 12/23/21 0448 98 %     Weight 12/23/21 0447 195 lb (88.5 kg)     Height 12/23/21 0447 5\' 10"  (1.778 m)     Head Circumference --      Peak Flow --      Pain Score 12/23/21 0447 8     Pain Loc --      Pain Edu? --      Excl. in [place]? --     Most recent vital signs: Vitals:   12/23/21 0448  BP: (!) 144/108  Pulse: 66  Resp: 20  Temp: 97.9 F (36.6 C)  SpO2: 98%     General: Awake, no distress.  CV:  Good peripheral perfusion.  Resp:  Normal effort.  Abd:  No distention.  No tenderness no CVA tenderness    ED Results / Procedures / Treatments   Labs (all labs ordered are listed, but only abnormal results are displayed) Labs Reviewed  CBC WITH DIFFERENTIAL/PLATELET - Abnormal; Notable for the following components:      Result Value   WBC 14.1 (*)    MCHC 36.2 (*)    Neutro Abs 12.2 (*)    All other components within normal limits  COMPREHENSIVE METABOLIC PANEL - Abnormal; Notable for the following components:   Sodium 134 (*)    Glucose, Bld 116 (*)    BUN 24 (*)    Creatinine, Ser 1.31 (*)    Calcium 8.4 (*)    Total Bilirubin 2.4 (*)    All other components within normal limits  URINALYSIS, ROUTINE W REFLEX MICROSCOPIC - Abnormal; Notable for the following components:   Color, Urine YELLOW (*)    APPearance HAZY (*)    Hgb urine dipstick SMALL (*)    Ketones, ur 5 (*)     Leukocytes,Ua SMALL (*)    All other components within normal limits  URINE CULTURE     EKG    RADIOLOGY CT read by radiology reviewed and interpreted by me shows a kidney stone 2 x 2 x 3 mm right at the ureterovesical junction.  There is some haziness around the kidney and some hydronephrosis.    PROCEDURES:  Critical Care performed:   Procedures   MEDICATIONS ORDERED IN ED: Medications  ketorolac (TORADOL) 15 MG/ML injection 15 mg (has no administration in time range)     IMPRESSION / MDM / ASSESSMENT AND PLAN / ED COURSE  I reviewed the triage vital signs and the nursing notes. Patient's creatinine is slightly elevated from what I would expect.  His urine does not show any white cells.  There is no sign of any infection.  His pain is better and based  on the location of the stone I think he probably passed a stone already.  He is asking for something for pain just in case.  I will give him 1 shot of a small dose of Toradol which should be okay now that the stone has passed.  I will have him follow-up with urology.  Dr. Bernardo Heater is on-call.  I have provided his phone number. Differential diagnosis includes, but is not limited to, renal colic, UTI, pyelonephritis,  Patient's presentation is most consistent with acute complicated illness / injury requiring diagnostic workup.     FINAL CLINICAL IMPRESSION(S) / ED DIAGNOSES   Final diagnoses:  Renal colic     Rx / DC Orders   ED Discharge Orders     None        Note:  This document was prepared using Dragon voice recognition software and may include unintentional dictation errors.   Nena Polio, MD 12/23/21 [phone]

## 2021-12-24 LAB — URINE CULTURE: Culture: NO GROWTH

## 2021-12-28 ENCOUNTER — Other Ambulatory Visit: Payer: Self-pay | Admitting: Podiatry

## 2021-12-29 ENCOUNTER — Telehealth: Payer: Self-pay | Admitting: Podiatry

## 2021-12-29 NOTE — Telephone Encounter (Signed)
Called to and lvm to inform him he needs to be seen before Rx refill.

## 2021-12-29 NOTE — Telephone Encounter (Signed)
Noted, thanks!

## 2022-01-16 ENCOUNTER — Ambulatory Visit
Admission: EM | Admit: 2022-01-16 | Discharge: 2022-01-16 | Disposition: A | Discharge status: Home / Self Care | Payer: Commercial Managed Care - PPO

## 2022-01-16 DIAGNOSIS — J069 Acute upper respiratory infection, unspecified: Secondary | ICD-10-CM

## 2022-01-16 NOTE — ED Provider Notes (Signed)
Billy Patterson    CSN: [id number] Arrival date & time: 01/16/22  0911      History   Chief Complaint Chief Complaint  Patient presents with   Nasal Congestion   Hoarse   Sore Throat    HPI Billy Patterson is a 36 y.o. male.    Sore Throat    Presents to UC with complaint of cough, nasal congestion, hoarse voice, sore throat starting last night.  Past Medical History:  Diagnosis Date   ADHD (attention deficit hyperactivity disorder)    Allergy    Kidney stone     Patient Active Problem List   Diagnosis Date Noted   Rectal bleeding    Rectal ulcer    Perianal abscess    Erythema multiforme 02/08/2021   Hemorrhoid 02/08/2021   Closed fracture of distal phalanx of finger of left hand with mallet deformity 11/17/2020   Preventative health care 09/07/2015   ALLERGIC RHINITIS 11/26/2009   Attention deficit hyperactivity disorder (ADHD), inattentive type, severe 12/29/2006    Past Surgical History:  Procedure Laterality Date   COLONOSCOPY WITH PROPOFOL N/A 05/27/2021   Procedure: COLONOSCOPY WITH PROPOFOL;  Surgeon: Lin Landsman, MD;  Location: [place];  Service: Endoscopy;  Laterality: N/A;   DENTAL SURGERY         Home Medications    Prior to Admission medications   Medication Sig Start Date End Date Taking? Authorizing Provider  Dexmethylphenidate HCl 35 MG CP24 Take 35 mg by mouth daily after breakfast. 04/24/20 05/27/21  Delight Hoh, MD  fexofenadine (ALLEGRA) 180 MG tablet Take 180 mg by mouth daily.    [provider]  ketorolac (TORADOL) 10 MG tablet Take 1 tablet (10 mg total) by mouth as directed. Take 2 pills first dose; Then 1 pill every 6h prn, max dose of 4 pills in 24h 10/27/21   Hudnell, Colletta [place], NP  meloxicam (MOBIC) 15 MG tablet TAKE 1 TABLET (15 MG TOTAL) BY MOUTH DAILY. 05/05/21   McDonald, Stephan Minister, DPM  tamsulosin (FLOMAX) 0.4 MG CAPS capsule Take 1 capsule (0.4 mg total) by mouth as directed. Take 1  capsule bid x 3d, then 1 cap daily 10/27/21   Jeanie Sewer, NP    Family History Family History  Problem Relation Age of Onset   Heart disease Maternal Grandmother        cad   Diabetes Maternal Grandfather    Hypertension Father     Social History Social History   Tobacco Use   Smoking status: Never   Smokeless tobacco: Never  Vaping Use   Vaping Use: Never used  Substance Use Topics   Alcohol use: Yes    Alcohol/week: 2.0 - 9.0 standard drinks of alcohol    Types: 2 - 9 Cans of beer per week    Comment: social   Drug use: No     Allergies   Preparation h [pramox-pe-glycerin-petrolatum]   Review of Systems Review of Systems   Physical Exam Triage Vital Signs ED Triage Vitals  Enc Vitals Group     BP 01/16/22 1030 108/71     Pulse Rate 01/16/22 1030 78     Resp 01/16/22 1030 18     Temp 01/16/22 1030 97.7 F (36.5 C)     Temp Source 01/16/22 1030 Oral     SpO2 01/16/22 1030 95 %     Weight --      Height --      Head Circumference --  Peak Flow --      Pain Score 01/16/22 1032 4     Pain Loc --      Pain Edu? --      Excl. in [place]? --    No data found.  Updated Vital Signs BP 108/71 (BP Location: Right Arm)   Pulse 78   Temp 97.7 F (36.5 C) (Oral)   Resp 18   SpO2 95%   Visual Acuity Right Eye Distance:   Left Eye Distance:   Bilateral Distance:    Right Eye Near:   Left Eye Near:    Bilateral Near:     Physical Exam Vitals reviewed.  Constitutional:      Appearance: He is well-developed.  HENT:     Mouth/Throat:     Mouth: Mucous membranes are moist.     Pharynx: Posterior oropharyngeal erythema present. No oropharyngeal exudate.     Tonsils: No tonsillar exudate.  Cardiovascular:     Rate and Rhythm: Normal rate and regular rhythm.     Heart sounds: Normal heart sounds.  Pulmonary:     Effort: Pulmonary effort is normal.     Breath sounds: Normal breath sounds.  Skin:    General: Skin is warm and dry.   Neurological:     General: No focal deficit present.     Mental Status: He is alert and oriented to person, place, and time.  Psychiatric:        Mood and Affect: Mood normal.        Behavior: Behavior normal.      UC Treatments / Results  Labs (all labs ordered are listed, but only abnormal results are displayed) Labs Reviewed - No data to display  EKG   Radiology No results found.  Procedures Procedures (including critical care time)  Medications Ordered in UC Medications - No data to display  Initial Impression / Assessment and Plan / UC Course  I have reviewed the triage vital signs and the nursing notes.  Pertinent labs & imaging results that were available during my care of the patient were reviewed by me and considered in my medical decision making (see chart for details).   Patient is afebrile here without recent antipyretics. Satting well on room air. Overall is well appearing, well hydrated, without respiratory distress. Pulmonary exam is unremarkable.  Lungs CTAB.  Pharynx is mildly erythematous without any peritonsillar exudates present.  Likely viral URI with cough.  Declines respiratory swab given it is unlikely to change plan of care.  Discussed expected course of viral illness which is self-limiting, unresponsive to antibiotic medications.  Recommended use of OTC medications for symptom control including "cough and cold "medications for daytime or nighttime use.    Final Clinical Impressions(s) / UC Diagnoses   Final diagnoses:  None   Discharge Instructions   None    ED Prescriptions   None    PDMP not reviewed this encounter.   Rose Phi, FNP 01/16/22 1101

## 2022-01-16 NOTE — ED Triage Notes (Signed)
Pt. Presents to UC w/ c/o a cough, nasal drainage, hoarseness and a sore throat that started last night.

## 2022-01-16 NOTE — Discharge Instructions (Signed)
Follow up here or with your primary care provider if your symptoms are worsening or not improving.     

## 2022-01-28 ENCOUNTER — Ambulatory Visit
Admission: EM | Admit: 2022-01-28 | Discharge: 2022-01-28 | Disposition: A | Discharge status: Home / Self Care | Payer: Commercial Managed Care - PPO | Attending: Urgent Care | Admitting: Urgent Care

## 2022-01-28 DIAGNOSIS — J209 Acute bronchitis, unspecified: Secondary | ICD-10-CM | POA: Diagnosis not present

## 2022-01-28 DIAGNOSIS — H6691 Otitis media, unspecified, right ear: Secondary | ICD-10-CM

## 2022-01-28 DIAGNOSIS — J040 Acute laryngitis: Secondary | ICD-10-CM

## 2022-01-28 MED ORDER — AMOXICILLIN-POT CLAVULANATE 875-125 MG PO TABS: 1.0000 | ORAL_TABLET | Freq: Two times a day (BID) | ORAL | 0 refills | Status: DC

## 2022-01-28 MED ORDER — PREDNISONE 10 MG (21) PO TBPK: ORAL_TABLET | Freq: Every day | ORAL | 0 refills | Status: DC

## 2022-01-28 NOTE — ED Provider Notes (Signed)
[name]    CSN: [id number] Arrival date & time: 01/28/22  0848      History   Chief Complaint Chief Complaint  Patient presents with   Cough   Hoarse    HPI Billy Patterson is a 36 y.o. male.    Cough   Presents to UC with complaint of cough and hoarseness x 1 week.  He also reports pain in his right ear.  Denies fever, chills, myalgias.  Denies nausea, vomiting, diarrhea.  Past Medical History:  Diagnosis Date   ADHD (attention deficit hyperactivity disorder)    Allergy    Kidney stone     Patient Active Problem List   Diagnosis Date Noted   Rectal bleeding    Rectal ulcer    Perianal abscess    Erythema multiforme 02/08/2021   Hemorrhoid 02/08/2021   Closed fracture of distal phalanx of finger of left hand with mallet deformity 11/17/2020   Preventative health care 09/07/2015   ALLERGIC RHINITIS 11/26/2009   Attention deficit hyperactivity disorder (ADHD), inattentive type, severe 12/29/2006    Past Surgical History:  Procedure Laterality Date   COLONOSCOPY WITH PROPOFOL N/A 05/27/2021   Procedure: COLONOSCOPY WITH PROPOFOL;  Surgeon: Lin Landsman, MD;  Location: [place];  Service: Endoscopy;  Laterality: N/A;   DENTAL SURGERY         Home Medications    Prior to Admission medications   Medication Sig Start Date End Date Taking? Authorizing Provider  Dexmethylphenidate HCl 35 MG CP24 Take 35 mg by mouth daily after breakfast. 04/24/20 05/27/21  Delight Hoh, MD  fexofenadine (ALLEGRA) 180 MG tablet Take 180 mg by mouth daily.    [provider]  ketorolac (TORADOL) 10 MG tablet Take 1 tablet (10 mg total) by mouth as directed. Take 2 pills first dose; Then 1 pill every 6h prn, max dose of 4 pills in 24h 10/27/21   Hudnell, Colletta [place], NP  meloxicam (MOBIC) 15 MG tablet TAKE 1 TABLET (15 MG TOTAL) BY MOUTH DAILY. 05/05/21   McDonald, Stephan Minister, DPM  tamsulosin (FLOMAX) 0.4 MG CAPS capsule Take 1 capsule (0.4 mg  total) by mouth as directed. Take 1 capsule bid x 3d, then 1 cap daily 10/27/21   Jeanie Sewer, NP    Family History Family History  Problem Relation Age of Onset   Heart disease Maternal Grandmother        cad   Diabetes Maternal Grandfather    Hypertension Father     Social History Social History   Tobacco Use   Smoking status: Never   Smokeless tobacco: Never  Vaping Use   Vaping Use: Never used  Substance Use Topics   Alcohol use: Yes    Alcohol/week: 2.0 - 9.0 standard drinks of alcohol    Types: 2 - 9 Cans of beer per week    Comment: social   Drug use: No     Allergies   Preparation h [pramox-pe-glycerin-petrolatum]   Review of Systems Review of Systems  Respiratory:  Positive for cough.      Physical Exam Triage Vital Signs ED Triage Vitals [01/28/22 0921]  Enc Vitals Group     BP (!) 133/100     Pulse Rate 84     Resp 16     Temp 98.2 F (36.8 C)     Temp src      SpO2 97 %     Weight      Height  Head Circumference      Peak Flow      Pain Score 0     Pain Loc      Pain Edu?      Excl. in [place]?    No data found.  Updated Vital Signs BP (!) 133/100   Pulse 84   Temp 98.2 F (36.8 C)   Resp 16   SpO2 97%   Visual Acuity Right Eye Distance:   Left Eye Distance:   Bilateral Distance:    Right Eye Near:   Left Eye Near:    Bilateral Near:     Physical Exam Vitals reviewed.  Constitutional:      Appearance: Normal appearance.  HENT:     Head: Normocephalic and atraumatic.     Right Ear: Tympanic membrane is erythematous.     Left Ear: Tympanic membrane is not erythematous.  Cardiovascular:     Rate and Rhythm: Normal rate and regular rhythm.  Pulmonary:     Effort: Pulmonary effort is normal.     Breath sounds: Normal breath sounds.  Skin:    General: Skin is warm and dry.  Neurological:     General: No focal deficit present.     Mental Status: He is alert and oriented to person, place, and time.  Psychiatric:         Mood and Affect: Mood normal.        Behavior: Behavior normal.      UC Treatments / Results  Labs (all labs ordered are listed, but only abnormal results are displayed) Labs Reviewed - No data to display  EKG   Radiology No results found.  Procedures Procedures (including critical care time)  Medications Ordered in UC Medications - No data to display  Initial Impression / Assessment and Plan / UC Course  I have reviewed the triage vital signs and the nursing notes.  Pertinent labs & imaging results that were available during my care of the patient were reviewed by me and considered in my medical decision making (see chart for details).   Patient is afebrile here without recent antipyretics. Satting well on room air. Overall is well appearing, well hydrated, without respiratory distress. Pulmonary exam is unremarkable. Lungs CTAB. Some pharyngeal erythema without peritonsillar exudate. R TM is erythematous.   Will treat for possible right acute otitis media given erythema at the TM.  Also will treat for laryngitis and bronchitis with prednisone taper.  Final Clinical Impressions(s) / UC Diagnoses   Final diagnoses:  None   Discharge Instructions   None    ED Prescriptions   None    PDMP not reviewed this encounter.   Rose Phi, [place] 01/28/22 [phone]

## 2022-01-28 NOTE — ED Triage Notes (Signed)
Pt. Presents to UC w/ c/o a cough and hoarseness for the past week.

## 2022-01-28 NOTE — Discharge Instructions (Signed)
Follow up here or with your primary care provider if your symptoms are worsening or not improving with treatment.     

## 2022-02-23 ENCOUNTER — Ambulatory Visit
Admission: EM | Admit: 2022-02-23 | Discharge: 2022-02-23 | Disposition: A | Discharge status: Home / Self Care | Payer: Commercial Managed Care - PPO | Attending: Emergency Medicine | Admitting: Emergency Medicine

## 2022-02-23 DIAGNOSIS — Z1152 Encounter for screening for COVID-19: Secondary | ICD-10-CM | POA: Diagnosis not present

## 2022-02-23 DIAGNOSIS — J069 Acute upper respiratory infection, unspecified: Secondary | ICD-10-CM | POA: Insufficient documentation

## 2022-02-23 DIAGNOSIS — H6692 Otitis media, unspecified, left ear: Secondary | ICD-10-CM | POA: Insufficient documentation

## 2022-02-23 DIAGNOSIS — R051 Acute cough: Secondary | ICD-10-CM | POA: Diagnosis not present

## 2022-02-23 DIAGNOSIS — R059 Cough, unspecified: Secondary | ICD-10-CM | POA: Insufficient documentation

## 2022-02-23 DIAGNOSIS — J029 Acute pharyngitis, unspecified: Secondary | ICD-10-CM | POA: Diagnosis present

## 2022-02-23 LAB — POCT RAPID STREP A (OFFICE): Rapid Strep A Screen: NEGATIVE

## 2022-02-23 MED ORDER — CEFDINIR 300 MG PO CAPS: 300.0000 mg | ORAL_CAPSULE | Freq: Two times a day (BID) | ORAL | 0 refills | Status: AC

## 2022-02-23 NOTE — Discharge Instructions (Addendum)
Take the antibiotic as directed.  Follow up with your primary care provider.    

## 2022-02-23 NOTE — ED Triage Notes (Signed)
Patient to Urgent Care with complaints of cough, sinus pressure and fatigue.   Reports he has been coughing all night. Describes cough is deep, dry and unproductive.   Symptoms started last night. Possible fever last night.

## 2022-02-23 NOTE — ED Provider Notes (Signed)
[name]    CSN: [id number] Arrival date & time: 02/23/22  0818      History   Chief Complaint Chief Complaint  Patient presents with   Cough    HPI Billy Patterson is a 36 y.o. male.  Patient presents with 1 to 2-day history of ear pain, congestion, sore throat, cough, fatigue.  No fever, rash, shortness of breath, pain, diarrhea, or other symptoms. No OTC medication taken today.  Patient was seen at this urgent care on 01/28/2022; diagnosed with laryngitis, bronchitis, right otitis media; treated with Augmentin and prednisone.  The history is provided by the patient and medical records.    Past Medical History:  Diagnosis Date   ADHD (attention deficit hyperactivity disorder)    Allergy    Kidney stone     Patient Active Problem List   Diagnosis Date Noted   Cough 02/23/2022   Rectal bleeding    Rectal ulcer    Perianal abscess    Erythema multiforme 02/08/2021   Hemorrhoid 02/08/2021   Closed fracture of distal phalanx of finger of left hand with mallet deformity 11/17/2020   Preventative health care 09/07/2015   ALLERGIC RHINITIS 11/26/2009   Attention deficit hyperactivity disorder (ADHD), inattentive type, severe 12/29/2006    Past Surgical History:  Procedure Laterality Date   COLONOSCOPY WITH PROPOFOL N/A 05/27/2021   Procedure: COLONOSCOPY WITH PROPOFOL;  Surgeon: Lin Landsman, MD;  Location: [facility];  Service: Endoscopy;  Laterality: N/A;   DENTAL SURGERY         Home Medications    Prior to Admission medications   Medication Sig Start Date End Date Taking? Authorizing Provider  cefdinir (OMNICEF) 300 MG capsule Take 1 capsule (300 mg total) by mouth 2 (two) times daily for 10 days. 02/23/22 03/05/22 Yes Sharion Balloon, NP  Dexmethylphenidate HCl 35 MG CP24 Take 35 mg by mouth daily after breakfast. 04/24/20 05/27/21  Delight Hoh, MD  fexofenadine (ALLEGRA) 180 MG tablet Take 180 mg by mouth daily.    [provider]  ketorolac (TORADOL) 10 MG tablet Take 1 tablet (10 mg total) by mouth as directed. Take 2 pills first dose; Then 1 pill every 6h prn, max dose of 4 pills in 24h 10/27/21   Hudnell, Colletta [place], NP  meloxicam (MOBIC) 15 MG tablet TAKE 1 TABLET (15 MG TOTAL) BY MOUTH DAILY. 05/05/21   McDonald, Stephan Minister, DPM  predniSONE (STERAPRED UNI-PAK 21 TAB) 10 MG (21) TBPK tablet Take by mouth daily. Take 6 tabs by mouth daily for 1 day, then 5 tabs for 1 day, then 4 tabs for 1 day, then 3 tabs for 1 day, then 2 tabs for 1 day, then 1 tab by mouth daily for 1 day 01/28/22   Immordino, Annie Main, FNP  tamsulosin (FLOMAX) 0.4 MG CAPS capsule Take 1 capsule (0.4 mg total) by mouth as directed. Take 1 capsule bid x 3d, then 1 cap daily 10/27/21   Jeanie Sewer, NP    Family History Family History  Problem Relation Age of Onset   Heart disease Maternal Grandmother        cad   Diabetes Maternal Grandfather    Hypertension Father     Social History Social History   Tobacco Use   Smoking status: Never   Smokeless tobacco: Never  Vaping Use   Vaping Use: Never used  Substance Use Topics   Alcohol use: Not Currently    Alcohol/week: 2.0 - 9.0 standard  drinks of alcohol    Types: 2 - 9 Cans of beer per week    Comment: social   Drug use: No     Allergies   Preparation h [pramox-pe-glycerin-petrolatum]   Review of Systems Review of Systems  Constitutional:  Negative for chills and fever.  HENT:  Positive for congestion, ear pain and sore throat.   Respiratory:  Positive for cough. Negative for shortness of breath.   Cardiovascular:  Negative for chest pain and palpitations.  Gastrointestinal:  Negative for diarrhea and vomiting.  Skin:  Negative for color change and rash.  All other systems reviewed and are negative.    Physical Exam Triage Vital Signs ED Triage Vitals  Enc Vitals Group     BP      Pulse      Resp      Temp      Temp src      SpO2      Weight       Height      Head Circumference      Peak Flow      Pain Score      Pain Loc      Pain Edu?      Excl. in [place]?    No data found.  Updated Vital Signs BP 128/84   Pulse (!) 106   Temp 98.6 F (37 C)   Resp 18   Ht 5\' 10"  (1.778 m)   Wt 190 lb (86.2 kg)   SpO2 98%   BMI 27.26 kg/m   Visual Acuity Right Eye Distance:   Left Eye Distance:   Bilateral Distance:    Right Eye Near:   Left Eye Near:    Bilateral Near:     Physical Exam Vitals and nursing note reviewed.  Constitutional:      General: He is not in acute distress.    Appearance: Normal appearance. He is well-developed. He is not ill-appearing.  HENT:     Right Ear: Tympanic membrane normal.     Left Ear: Tympanic membrane is erythematous.     Nose: Nose normal.     Mouth/Throat:     Mouth: Mucous membranes are moist.     Pharynx: Oropharynx is clear.  Cardiovascular:     Rate and Rhythm: Normal rate and regular rhythm.     Heart sounds: Normal heart sounds.  Pulmonary:     Effort: Pulmonary effort is normal. No respiratory distress.     Breath sounds: Normal breath sounds.  Musculoskeletal:     Cervical back: Neck supple.  Skin:    General: Skin is warm and dry.  Neurological:     Mental Status: He is alert.  Psychiatric:        Mood and Affect: Mood normal.        Behavior: Behavior normal.      UC Treatments / Results  Labs (all labs ordered are listed, but only abnormal results are displayed) Labs Reviewed  RESP PANEL BY RT-PCR (FLU A&B, COVID) ARPGX2  POCT RAPID STREP A (OFFICE)    EKG   Radiology No results found.  Procedures Procedures (including critical care time)  Medications Ordered in UC Medications - No data to display  Initial Impression / Assessment and Plan / UC Course  I have reviewed the triage vital signs and the nursing notes.  Pertinent labs & imaging results that were available during my care of the patient were reviewed by me and considered in  my medical  decision making (see chart for details).   Left otitis media, URI, cough.  Rapid strep negative.  COVID and flu pending.  Treating otitis media with cefdinir.  Discussed symptomatic treatment including Tylenol or ibuprofen, rest, hydration.  Instructed patient to follow up with PCP.  He agrees to plan of care.    Final Clinical Impressions(s) / UC Diagnoses   Final diagnoses:  Acute cough  Left otitis media, unspecified otitis media type  Acute upper respiratory infection     Discharge Instructions      Take the antibiotic as directed.  Follow up with your primary care provider.        ED Prescriptions     Medication Sig Dispense Auth. Provider   cefdinir (OMNICEF) 300 MG capsule Take 1 capsule (300 mg total) by mouth 2 (two) times daily for 10 days. 20 capsule Sharion Balloon, NP      PDMP not reviewed this encounter.   Sharion Balloon, NP 02/23/22 [phone]

## 2022-02-24 LAB — RESP PANEL BY RT-PCR (FLU A&B, COVID) ARPGX2
Influenza A by PCR: POSITIVE — AB
Influenza B by PCR: NEGATIVE
SARS Coronavirus 2 by RT PCR: NEGATIVE

## 2022-05-12 ENCOUNTER — Other Ambulatory Visit (HOSPITAL_BASED_OUTPATIENT_CLINIC_OR_DEPARTMENT_OTHER): Payer: Self-pay

## 2022-05-12 MED ORDER — DEXMETHYLPHENIDATE HCL ER 35 MG PO CP24
35.0000 mg | ORAL_CAPSULE | ORAL | 0 refills | Status: AC
  Filled 2022-05-12: qty 30, 30d supply, fill #0

## 2022-07-02 ENCOUNTER — Ambulatory Visit
Admission: EM | Admit: 2022-07-02 | Discharge: 2022-07-02 | Disposition: A | Discharge status: Home / Self Care | Payer: Commercial Managed Care - PPO | Attending: Urgent Care | Admitting: Urgent Care

## 2022-07-02 DIAGNOSIS — J069 Acute upper respiratory infection, unspecified: Secondary | ICD-10-CM | POA: Diagnosis not present

## 2022-07-02 MED ORDER — AZELASTINE HCL 0.1 % NA SOLN: 2.0000 | Freq: Two times a day (BID) | NASAL | 12 refills | Status: DC

## 2022-07-02 MED ORDER — FLUTICASONE PROPIONATE 50 MCG/ACT NA SUSP: 2.0000 | Freq: Every day | NASAL | 0 refills | Status: DC

## 2022-07-02 NOTE — ED Triage Notes (Signed)
Patient presents to UC for sinus infection. States nasal congestion, cough, and HA since yesterday. Taking OTC sinus med and mucinex.

## 2022-07-02 NOTE — ED Provider Notes (Signed)
[name]    CSN: [id number] Arrival date & time: 07/02/22  1054      History   Chief Complaint Chief Complaint  Patient presents with   Nasal Congestion    HPI Billy Patterson is a 37 y.o. male.   HPI  Presents to urgent care with complaint of sinus infection.  He endorses nasal congestion cough and headache, facial pressure starting yesterday. Mild cough. Denies fever. Nasal drainage.  Past Medical History:  Diagnosis Date   ADHD (attention deficit hyperactivity disorder)    Allergy    Kidney stone     Patient Active Problem List   Diagnosis Date Noted   Cough 02/23/2022   Rectal bleeding    Rectal ulcer    Perianal abscess    Erythema multiforme 02/08/2021   Hemorrhoid 02/08/2021   Closed fracture of distal phalanx of finger of left hand with mallet deformity 11/17/2020   Preventative health care 09/07/2015   ALLERGIC RHINITIS 11/26/2009   Attention deficit hyperactivity disorder (ADHD), inattentive type, severe 12/29/2006    Past Surgical History:  Procedure Laterality Date   COLONOSCOPY WITH PROPOFOL N/A 05/27/2021   Procedure: COLONOSCOPY WITH PROPOFOL;  Surgeon: Toney Reil, MD;  Location: [facility];  Service: Endoscopy;  Laterality: N/A;   DENTAL SURGERY         Home Medications    Prior to Admission medications   Medication Sig Start Date End Date Taking? Authorizing Provider  Dexmethylphenidate HCl 35 MG CP24 Take 35 mg by mouth daily after breakfast. 04/24/20 05/27/21  Chauncey Mann, MD  Dexmethylphenidate HCl 35 MG CP24 Take 35 mg by mouth every morning. 05/12/22     fexofenadine (ALLEGRA) 180 MG tablet Take 180 mg by mouth daily.    [provider]  ketorolac (TORADOL) 10 MG tablet Take 1 tablet (10 mg total) by mouth as directed. Take 2 pills first dose; Then 1 pill every 6h prn, max dose of 4 pills in 24h 10/27/21   Hudnell, Judeth Cornfield, NP  meloxicam (MOBIC) 15 MG tablet TAKE 1 TABLET (15 MG TOTAL) BY  MOUTH DAILY. 05/05/21   McDonald, Rachelle Hora, DPM  predniSONE (STERAPRED UNI-PAK 21 TAB) 10 MG (21) TBPK tablet Take by mouth daily. Take 6 tabs by mouth daily for 1 day, then 5 tabs for 1 day, then 4 tabs for 1 day, then 3 tabs for 1 day, then 2 tabs for 1 day, then 1 tab by mouth daily for 1 day 01/28/22   Emmeline Winebarger, Jeannett Senior, FNP  tamsulosin (FLOMAX) 0.4 MG CAPS capsule Take 1 capsule (0.4 mg total) by mouth as directed. Take 1 capsule bid x 3d, then 1 cap daily 10/27/21   Dulce Sellar, NP    Family History Family History  Problem Relation Age of Onset   Heart disease Maternal Grandmother        cad   Diabetes Maternal Grandfather    Hypertension Father     Social History Social History   Tobacco Use   Smoking status: Never   Smokeless tobacco: Never  Vaping Use   Vaping Use: Never used  Substance Use Topics   Alcohol use: Not Currently    Alcohol/week: 2.0 - 9.0 standard drinks of alcohol    Types: 2 - 9 Cans of beer per week    Comment: social   Drug use: No     Allergies   Preparation h [pramox-pe-glycerin-petrolatum]   Review of Systems Review of Systems   Physical Exam  Triage Vital Signs ED Triage Vitals  Enc Vitals Group     BP 07/02/22 1141 (!) 131/91     Pulse Rate 07/02/22 1141 91     Resp 07/02/22 1141 18     Temp 07/02/22 1141 97.8 F (36.6 C)     Temp Source 07/02/22 1141 Oral     SpO2 07/02/22 1141 98 %     Weight --      Height --      Head Circumference --      Peak Flow --      Pain Score 07/02/22 1140 0     Pain Loc --      Pain Edu? --      Excl. in GC? --    No data found.  Updated Vital Signs BP (!) 131/91 (BP Location: Left Arm)   Pulse 91   Temp 97.8 F (36.6 C) (Oral)   Resp 18   SpO2 98%   Visual Acuity Right Eye Distance:   Left Eye Distance:   Bilateral Distance:    Right Eye Near:   Left Eye Near:    Bilateral Near:     Physical Exam Constitutional:      Appearance: Normal appearance.  HENT:     Right Ear:  Tympanic membrane normal.     Left Ear: Tympanic membrane normal.     Nose: Congestion and rhinorrhea present.     Mouth/Throat:     Pharynx: Posterior oropharyngeal erythema present. No oropharyngeal exudate.  Cardiovascular:     Rate and Rhythm: Normal rate and regular rhythm.     Pulses: Normal pulses.     Heart sounds: Normal heart sounds.  Pulmonary:     Effort: Pulmonary effort is normal.     Breath sounds: Normal breath sounds.  Skin:    General: Skin is warm and dry.  Neurological:     General: No focal deficit present.     Mental Status: He is alert and oriented to person, place, and time.  Psychiatric:        Mood and Affect: Mood normal.        Behavior: Behavior normal.      UC Treatments / Results  Labs (all labs ordered are listed, but only abnormal results are displayed) Labs Reviewed - No data to display  EKG   Radiology No results found.  Procedures Procedures (including critical care time)  Medications Ordered in UC Medications - No data to display  Initial Impression / Assessment and Plan / UC Course  I have reviewed the triage vital signs and the nursing notes.  Pertinent labs & imaging results that were available during my care of the patient were reviewed by me and considered in my medical decision making (see chart for details).   Billy Patterson is a 37 y.o. male presenting with URI symptoms. Patient is afebrile without recent antipyretics, satting well on room air. Overall is well appearing and non-toxic, well hydrated, without respiratory distress. Pulmonary exam is unremarkable.  Lungs CTAB without wheezing, rhonchi, rales.  TMs are WNL bilaterally.  There is nasal congestion with rhinorrhea.  Mild pharyngeal erythema.  No peritonsillar exudates.  Patient's symptoms are consistent with an acute viral process.  Discussed antibiotic stewardship with patient informing him that antibiotics are not effective against viral illnesses.  Recommending  supportive care and use of OTC medication for symptom control.  Counseled patient on potential for adverse effects with medications prescribed/recommended today, ER and return-to-clinic precautions  discussed, patient verbalized understanding and agreement with care plan.   Final Clinical Impressions(s) / UC Diagnoses   Final diagnoses:  None   Discharge Instructions   None    ED Prescriptions   None    PDMP not reviewed this encounter.   Charma Igo, [place] 07/02/22 1151

## 2022-07-02 NOTE — Discharge Instructions (Addendum)
We recommend you use over-the-counter medications for symptom control including acetaminophen (Tylenol), ibuprofen (Advil/Motrin) or naproxen (Aleve) for throat pain, fever, chills or body aches. You may combine use of acetaminophen and ibuprofen/naproxen if needed.  Some patients find an pain-relieving throat spray such as Chloraseptic to be effective.  Also recommend cold/cough medication containing a cough suppressant such as dextromethorphan, as needed. Please note that some cough medications are not recommended if you suffer from hypertension.       Saline mist spray is helpful for removing excess mucus from your nose.  Room humidifiers are helpful to ease breathing at night. I recommend guaifenesin (Mucinex) with plenty of water throughout the day to help thin and loosen mucus secretions in your respiratory passages.   If appropriate based upon your other medical problems, you might also find relief of nasal/sinus congestion symptoms by using a nasal decongestant such as fluticasone (Flonase ) or pseudoephedrine (Sudafed sinus).  You will need to obtain Sudafed from behind the pharmacist counter.  Speak to the pharmacist to verify that you are not duplicating medications with other over-the-counter formulations that you may be using.   

## 2022-09-29 ENCOUNTER — Ambulatory Visit: Payer: Commercial Managed Care - PPO | Admitting: Internal Medicine

## 2022-09-29 VITALS — BP 102/70 | HR 78 | Temp 97.8°F | Ht 70.0 in | Wt 206.0 lb

## 2022-09-29 DIAGNOSIS — R1903 Right lower quadrant abdominal swelling, mass and lump: Secondary | ICD-10-CM | POA: Diagnosis not present

## 2022-09-29 NOTE — Progress Notes (Signed)
Subjective:    Patient ID: Billy Patterson, male    DOB: 14-Jun-1985, 37 y.o.   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 No blood  Tried ibuprofen--did help some  Current Outpatient Medications on File Prior to Visit  Medication Sig Dispense Refill   azelastine (ASTELIN) 0.1 % nasal spray Place 2 sprays into both nostrils 2 (two) times daily. Use in each nostril as directed 30 mL 12   Dexmethylphenidate HCl 35 MG CP24 Take 35 mg by mouth every morning. 30 capsule 0   fexofenadine (ALLEGRA) 180 MG tablet Take 180 mg by mouth daily.     fluticasone (FLONASE) 50 MCG/ACT nasal spray Place 2 sprays into both nostrils daily. 9.9 mL 0   meloxicam (MOBIC) 15 MG tablet TAKE 1 TABLET (15 MG TOTAL) BY MOUTH DAILY. 30 tablet 3   No current facility-administered medications on file prior to visit.    Allergies  Allergen Reactions   Preparation H [Pramox-Pe-Glycerin-Petrolatum] Hives    Not sure which product     Past Medical History:  Diagnosis Date   ADHD (attention deficit hyperactivity disorder)    Allergy    Kidney stone     Past Surgical History:  Procedure Laterality Date   COLONOSCOPY WITH PROPOFOL N/A 05/27/2021   Procedure: COLONOSCOPY WITH PROPOFOL;  Surgeon: Toney Reil, MD;  Location: [facility];  Service: Endoscopy;  Laterality: N/A;   DENTAL SURGERY      Family History  Problem Relation Age of Onset   Heart disease Maternal Grandmother        cad   Diabetes Maternal Grandfather    Hypertension Father     Social History   Socioeconomic History   Marital status: Single    Spouse name: Not on file   Number of children: 0   Years of education: Not on file   Highest education level: Associate degree: academic program   Occupational History   Occupation: Quality control     Comment: Ecologist  Tobacco Use   Smoking status: Never    Passive exposure: Never   Smokeless tobacco: Never  Vaping Use   Vaping status: Never Used  Substance and Sexual Activity   Alcohol use: Not Currently    Alcohol/week: 2.0 - 9.0 standard drinks of alcohol    Types: 2 - 9 Cans of beer per week    Comment: social   Drug use: No   Sexual activity: Not on file  Other Topics Concern   Not on file  Social History Narrative   Not on file   Social Determinants of Health   Financial Resource Strain: Low Risk  (09/29/2022)   Overall Financial Resource Strain (CARDIA)    Difficulty of Paying Living Expenses: Not hard at all  Food Insecurity: No Food Insecurity (09/29/2022)   Hunger Vital Sign    Worried About Running Out of Food in the Last Year: Never true    Ran Out of Food in the Last Year: Never true  Transportation Needs: No Transportation Needs (09/29/2022)   PRAPARE - Administrator, Civil Service (Medical): No    Lack of Transportation (Non-Medical): No  Physical Activity: Unknown (09/29/2022)   Exercise Vital Sign    Days of  Exercise per Week: 3 days    Minutes of Exercise per Session: Patient declined  Stress: No Stress Concern Present (09/29/2022)   [employer] of Occupational Health - Occupational Stress Questionnaire    Feeling of Stress : Only a little  Social Connections: Unknown (09/29/2022)   Social Connection and Isolation Panel [NHANES]    Frequency of Communication with Friends and Family: More than three times a week    Frequency of Social Gatherings with Friends and Family: Three times a week    Attends Religious Services: More than 4 times per year    Active Member of Clubs or Organizations: Patient declined    Attends Banker Meetings: Not on file    Marital Status: Living with partner  Intimate Partner Violence: Not on file   Review of Systems No fever Has gained  considerable weight---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

## 2022-09-29 NOTE — Assessment & Plan Note (Signed)
No systemic symptoms Exam benign Heating pad helps Does seem muscular Discussed safe lifting---supportive care

## 2023-06-04 ENCOUNTER — Other Ambulatory Visit: Payer: Self-pay

## 2023-06-04 ENCOUNTER — Emergency Department
Admission: EM | Admit: 2023-06-04 | Discharge: 2023-06-04 | Disposition: A | Discharge status: Home / Self Care | Attending: Student in an Organized Health Care Education/Training Program | Admitting: Student in an Organized Health Care Education/Training Program

## 2023-06-04 ENCOUNTER — Emergency Department

## 2023-06-04 DIAGNOSIS — R1031 Right lower quadrant pain: Secondary | ICD-10-CM | POA: Diagnosis not present

## 2023-06-04 DIAGNOSIS — N50811 Right testicular pain: Secondary | ICD-10-CM | POA: Diagnosis present

## 2023-06-04 LAB — URINALYSIS, ROUTINE W REFLEX MICROSCOPIC
Bacteria, UA: NONE SEEN
Bilirubin Urine: NEGATIVE
Glucose, UA: NEGATIVE mg/dL
Ketones, ur: NEGATIVE mg/dL
Leukocytes,Ua: NEGATIVE
Nitrite: NEGATIVE
Protein, ur: NEGATIVE mg/dL
Specific Gravity, Urine: 1.005 (ref 1.005–1.030)
pH: 6 (ref 5.0–8.0)

## 2023-06-04 NOTE — ED Notes (Signed)
 Ultrasound Tech at bedside.

## 2023-06-04 NOTE — ED Notes (Signed)
 Patient transported to CT

## 2023-06-04 NOTE — ED Provider Notes (Signed)
 [facility] Provider Note    Event Date/Time   First MD Initiated Contact with Patient 06/04/23 1042     (approximate)   History   Testicle Pain   HPI  Billy Patterson is a 38 y.o. male presents the ER for evaluation of right testicular pain for the past 24 hours.  Aching in nature.  Having difficulty walking.  Does have history of kidney stone.  Denies any dysuria.  Denies any abdominal pain.  No nausea or vomiting.  No fevers or chills.     Physical Exam   Triage Vital Signs: ED Triage Vitals [06/04/23 1001]  Encounter Vitals Group     BP (!) 138/92     Systolic BP Percentile      Diastolic BP Percentile      Pulse Rate (!) 107     Resp 17     Temp 97.9 F (36.6 C)     Temp Source Oral     SpO2 100 %     Weight 200 lb (90.7 kg)     Height 5\' 10"  (1.778 m)     Head Circumference      Peak Flow      Pain Score 8     Pain Loc      Pain Education      Exclude from Growth Chart     Most recent vital signs: Vitals:   06/04/23 1001  BP: (!) 138/92  Pulse: (!) 107  Resp: 17  Temp: 97.9 F (36.6 C)  SpO2: 100%     Constitutional: Alert  Eyes: Conjunctivae are normal.  Head: Atraumatic. Nose: No congestion/rhinnorhea. Mouth/Throat: Mucous membranes are moist.   Neck: Painless ROM.  Cardiovascular:   Good peripheral circulation. Respiratory: Normal respiratory effort.  No retractions.  Gastrointestinal: Soft and nontender.  No appreciable hernia in the groin.  Cremasteric reflex is present bilaterally.  Scrotum appears benign. Musculoskeletal:  no deformity Neurologic:  MAE spontaneously. No gross focal neurologic deficits are appreciated.  Skin:  Skin is warm, dry and intact. No rash noted. Psychiatric: Mood and affect are normal. Speech and behavior are normal.    ED Results / Procedures / Treatments   Labs (all labs ordered are listed, but only abnormal results are displayed) Labs Reviewed  URINALYSIS, ROUTINE W REFLEX  MICROSCOPIC - Abnormal; Notable for the following components:      Result Value   Color, Urine STRAW (*)    APPearance CLEAR (*)    Hgb urine dipstick SMALL (*)    All other components within normal limits     EKG     RADIOLOGY Please see ED Course for my review and interpretation.  I personally reviewed all radiographic images ordered to evaluate for the above acute complaints and reviewed radiology reports and findings.  These findings were personally discussed with the patient.  Please see medical record for radiology report.    PROCEDURES:  Critical Care performed:   Procedures   MEDICATIONS ORDERED IN ED: Medications - No data to display   IMPRESSION / MDM / ASSESSMENT AND PLAN / ED COURSE  I reviewed the triage vital signs and the nursing notes.                              Differential diagnosis includes, but is not limited to, epididymitis, orchitis, hernia, torsion, mass, UTI, stone, colitis, musculoskeletal strain  Patient presented to the ER  for evaluation of symptoms as described above.  UA without evidence of infection.  Possible testicular pathology therefore will order ultrasound.  Patient states he feels comfortable right now and does not require any pain medication.   Clinical Course as of 06/04/23 1230  Sun Jun 04, 2023  1139 Ultrasound testicle my review and interpretation with good Doppler signal in the right testicle.  Less consistent with torsion.  Will await formal radiology. [PR]  1228 Patient reassessed.  He is nontoxic-appearing imaging is reassuring.  CT with some nonobstructing renal no sign of ureterolithiasis.  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  Possible recent passed stone.  Does not appear consistent with torsion, epididymitis or orchitis.  We discussed supportive care and signs and symptoms for which the patient should return to the ER. [PR]    Clinical Course User Index [PR] Willy Eddy, MD      FINAL CLINICAL IMPRESSION(S) / ED DIAGNOSES   Final diagnoses:  Groin pain, right     Rx / DC Orders   ED Discharge Orders     None        Note:  This document was prepared using Dragon voice recognition software and may include unintentional dictation errors.    Willy Eddy, MD 06/04/23 1230

## 2023-06-04 NOTE — ED Notes (Signed)
 Called for triage but pt needed to go have a bowel movement

## 2023-06-04 NOTE — ED Triage Notes (Signed)
 Pt sts that he has been having right testicle pain since yesterday morning. Pt sts that he is having right groin swelling and also may have a hernia also.

## 2023-06-09 ENCOUNTER — Telehealth: Payer: Self-pay

## 2023-06-09 NOTE — Transitions of Care (Post Inpatient/ED Visit) (Signed)
 Unable to reach patient by phone and left v/m requesting call back at [phone].       06/09/2023  Name: Billy Patterson MRN: [id number] DOB: 08-19-1985  Today's TOC FU Call Status: Today's TOC FU Call Status:: Unsuccessful Call (1st Attempt) Unsuccessful Call (1st Attempt) Date: 06/09/23  Attempted to reach the patient regarding the most recent Inpatient/ED visit.  Follow Up Plan: Additional outreach attempts will be made to reach the patient to complete the Transitions of Care (Post Inpatient/ED visit) call.   Signature  Lewanda Rife, LPN

## 2023-06-12 NOTE — Transitions of Care (Post Inpatient/ED Visit) (Signed)
 Unable to reach patient by phone and left v/m requesting call back at [phone].        06/12/2023  Name: Billy Patterson MRN: [id number] DOB: 24-Oct-1985  Today's TOC FU Call Status: Today's TOC FU Call Status:: Unsuccessful Call (2nd Attempt) Unsuccessful Call (1st Attempt) Date: 06/09/23 Unsuccessful Call (2nd Attempt) Date: 06/12/23  Attempted to reach the patient regarding the most recent Inpatient/ED visit.  Follow Up Plan: Additional outreach attempts will be made to reach the patient to complete the Transitions of Care (Post Inpatient/ED visit) call.   Signature Lewanda Rife, LPN

## 2023-06-13 ENCOUNTER — Encounter: Payer: Self-pay | Admitting: Emergency Medicine

## 2023-06-13 ENCOUNTER — Emergency Department
Admission: EM | Admit: 2023-06-13 | Discharge: 2023-06-14 | Disposition: A | Discharge status: Home / Self Care | Attending: Emergency Medicine | Admitting: Emergency Medicine

## 2023-06-13 ENCOUNTER — Emergency Department

## 2023-06-13 ENCOUNTER — Other Ambulatory Visit: Payer: Self-pay

## 2023-06-13 ENCOUNTER — Ambulatory Visit: Payer: Self-pay

## 2023-06-13 DIAGNOSIS — N50811 Right testicular pain: Secondary | ICD-10-CM | POA: Diagnosis present

## 2023-06-13 DIAGNOSIS — M545 Low back pain, unspecified: Secondary | ICD-10-CM | POA: Insufficient documentation

## 2023-06-13 DIAGNOSIS — N433 Hydrocele, unspecified: Secondary | ICD-10-CM

## 2023-06-13 DIAGNOSIS — N451 Epididymitis: Secondary | ICD-10-CM | POA: Insufficient documentation

## 2023-06-13 DIAGNOSIS — N452 Orchitis: Secondary | ICD-10-CM

## 2023-06-13 LAB — URINALYSIS, W/ REFLEX TO CULTURE (INFECTION SUSPECTED)
Bacteria, UA: NONE SEEN
Bilirubin Urine: NEGATIVE
Glucose, UA: NEGATIVE mg/dL
Hgb urine dipstick: NEGATIVE
Ketones, ur: NEGATIVE mg/dL
Leukocytes,Ua: NEGATIVE
Nitrite: NEGATIVE
Protein, ur: NEGATIVE mg/dL
Specific Gravity, Urine: 1.011 (ref 1.005–1.030)
pH: 6 (ref 5.0–8.0)

## 2023-06-13 MED ORDER — CIPROFLOXACIN HCL 500 MG PO TABS
500.0000 mg | ORAL_TABLET | Freq: Once | ORAL | Status: AC
  Administered 2023-06-14: 500 mg via ORAL
  Filled 2023-06-13: qty 1

## 2023-06-13 MED ORDER — KETOROLAC TROMETHAMINE 15 MG/ML IJ SOLN
15.0000 mg | Freq: Once | INTRAMUSCULAR | Status: AC
  Administered 2023-06-13: 15 mg via INTRAMUSCULAR
  Filled 2023-06-13: qty 1

## 2023-06-13 MED ORDER — CIPROFLOXACIN HCL 500 MG PO TABS: 500.0000 mg | ORAL_TABLET | Freq: Two times a day (BID) | ORAL | 0 refills | Status: DC

## 2023-06-13 MED ORDER — ACETAMINOPHEN 500 MG PO TABS
1000.0000 mg | ORAL_TABLET | Freq: Once | ORAL | Status: AC
  Administered 2023-06-13: 1000 mg via ORAL
  Filled 2023-06-13: qty 2

## 2023-06-13 MED ORDER — NAPROXEN 500 MG PO TABS
500.0000 mg | ORAL_TABLET | Freq: Two times a day (BID) | ORAL | 0 refills | Status: AC
Start: 2023-06-13 — End: ?

## 2023-06-13 NOTE — ED Triage Notes (Signed)
 Patient to ED via POV for lower back pain. Started today after lunch. Denies injury.

## 2023-06-13 NOTE — ED Provider Notes (Signed)
 [facility] Provider Note    Event Date/Time   First MD Initiated Contact with Patient 06/13/23 1815     (approximate)   History   Chief Complaint: Back Pain   HPI  Billy Patterson is a 38 y.o. male with no significant past medical history who comes ED complaining of right groin and right testicle pain that has been ongoing, waxing and waning and mild over the last 2 weeks but today after lunch became much more severe.  Also notes some swelling of the right side of his scrotum.  Denies any penile discharge or trauma.  No dysuria or fever.  Pain radiates up toward the right flank.  Notes a family history of kidney stones.          Physical Exam   Triage Vital Signs: ED Triage Vitals  Encounter Vitals Group     BP 06/13/23 1643 131/85     Systolic BP Percentile --      Diastolic BP Percentile --      Pulse Rate 06/13/23 1643 (!) 108     Resp 06/13/23 1643 17     Temp 06/13/23 1643 98.7 F (37.1 C)     Temp Source 06/13/23 1643 Oral     SpO2 06/13/23 1643 100 %     Weight 06/13/23 1642 200 lb (90.7 kg)     Height 06/13/23 1642 5\' 10"  (1.778 m)     Head Circumference --      Peak Flow --      Pain Score 06/13/23 1642 10     Pain Loc --      Pain Education --      Exclude from Growth Chart --     Most recent vital signs: Vitals:   06/13/23 1830 06/13/23 2121  BP: 115/81 122/81  Pulse: 100 (!) 102  Resp: 18 18  Temp:    SpO2: 98% 96%   Examined with spouse at bedside General: Awake, no distress.  CV:  Good peripheral perfusion.  Resp:  Normal effort.  Abd:  No distention.  Soft nontender.  No inguinal mass or tenderness. Other:  There is tenderness of the right testicle epididymis.  No horizontal lie.  There is some swelling and mild erythema of the right hemiscrotum.  No penile discharge.  No palpable scrotal mass   ED Results / Procedures / Treatments   Labs (all labs ordered are listed, but only abnormal results are  displayed) Labs Reviewed  URINALYSIS, W/ REFLEX TO CULTURE (INFECTION SUSPECTED) - Abnormal; Notable for the following components:      Result Value   Color, Urine YELLOW (*)    APPearance CLEAR (*)    All other components within normal limits     EKG    RADIOLOGY Ultrasound scrotum interpreted by me, normal vascularity and blood flow to bilateral testes.  Radiology report reviewed   PROCEDURES:  Procedures   MEDICATIONS ORDERED IN ED: Medications  ketorolac (TORADOL) 15 MG/ML injection 15 mg (15 mg Intramuscular Given 06/13/23 1835)  acetaminophen (TYLENOL) tablet 1,000 mg (1,000 mg Oral Given 06/13/23 2156)     IMPRESSION / MDM / ASSESSMENT AND PLAN / ED COURSE  I reviewed the triage vital signs and the nursing notes.  DDx: Epididymitis, testicular torsion, UTI, ureterolithiasis, varicocele  Patient's presentation is most consistent with acute presentation with potential threat to life or bodily function.  Patient presents with right testicular pain and swelling, tenderness on exam.  Doubt STI.  Will obtain ultrasound and urinalysis.  IM Toradol for pain relief.       FINAL CLINICAL IMPRESSION(S) / ED DIAGNOSES   Final diagnoses:  Right epididymitis     Rx / DC Orders   ED Discharge Orders     None        Note:  This document was prepared using Dragon voice recognition software and may include unintentional dictation errors.   Jacquie Maudlin, MD 06/17/23 [facility]

## 2023-06-13 NOTE — Telephone Encounter (Signed)
 Copied from CRM [id number]. Topic: Appointments - Appointment Scheduling >> Jun 13, 2023  3:13 PM Isabelle Course C wrote: Patient/patient representative is calling to schedule an appointment. may be trying to pass kidney stones... whole lower back is in severe pain. Groin in right testicle is swollen  Chief Complaint: back pain, right groin pain, right testicle is swollen and makes it painful to urinate Symptoms: see above Frequency: constant Pertinent Negatives: Patient denies fever, numbness, weakness Disposition: [x] ED /[] Urgent Care (no appt availability in office) / [] Appointment(In office/virtual)/ []  [facility]/ [] Home Care/ [] Refused Recommended Disposition /[] [facility]/ []  Follow-up with PCP Additional Notes: instructed to go to ER;   Reason for Disposition  Numbness in groin or rectal area (i.e., loss of sensation)  Answer Assessment - Initial Assessment Questions 1. ONSET: "When did the pain begin?"      Last night 2. LOCATION: "Where does it hurt?" (upper, mid or lower back)     Lower back  3. SEVERITY: "How bad is the pain?"  (e.g., Scale 1-10; mild, moderate, or severe)   - MILD (1-3): Doesn't interfere with normal activities.    - MODERATE (4-7): Interferes with normal activities or awakens from sleep.    - SEVERE (8-10): Excruciating pain, unable to do any normal activities.      10/10 4. PATTERN: "Is the pain constant?" (e.g., yes, no; constant, intermittent)      constant 5. RADIATION: "Does the pain shoot into your legs or somewhere else?"     Right groin hurts 6. CAUSE:  "What do you think is causing the back pain?"      States maybe kidney stones 7. BACK OVERUSE:  "Any recent lifting of heavy objects, strenuous work or exercise?"     denies 8. MEDICINES: "What have you taken so far for the pain?" (e.g., nothing, acetaminophen, NSAIDS)     aleeve 9. NEUROLOGIC SYMPTOMS: "Do you have any weakness, numbness, or problems with bowel/bladder control?"      Right testicle is swollen  10. OTHER SYMPTOMS: "Do you have any other symptoms?" (e.g., fever, abdomen pain, burning with urination, blood in urine)       Right testicle swollen,  11. PREGNANCY: "Is there any chance you are pregnant?" "When was your last menstrual period?"       na  Protocols used: Back Pain-A-AH

## 2023-06-13 NOTE — ED Notes (Signed)
 Pt to ED for bilateral lower back pain since last night but also has R testicle pain and swelling and R groin pain. Was seen here about 2 weeks ago and had testicular u/s and testicle was getting better but started getting more swollen again last night. Pt concerned he may have kidney stones--was told may have these last time was here. States lower back pain severe and is hard to walk. Denies dysuria and hematuria.

## 2023-06-13 NOTE — Discharge Instructions (Addendum)
 Take and finish antibiotic as prescribed. Take anti-inflammatory as needed. Return to the ER for worsening symptoms, persistent vomiting, fever or other concerns.

## 2023-06-13 NOTE — ED Notes (Signed)
 Called lab to send urine that was sent from triage.

## 2023-06-14 ENCOUNTER — Telehealth: Payer: Self-pay

## 2023-06-14 NOTE — Transitions of Care (Post Inpatient/ED Visit) (Signed)
 Pt seen [facility] ED 06/04/23 for rt groin pain and swollen rt testicle; pt had testing but ED could not tell pt what caused the pain except possible pull of groin. Pt said pain and swelling never went away. Pt went back to [facility] ED on 06/13/23; see TOC for 06/13/23 visit as well; sending note to Dr Alphonsus Sias. UC & ED precautions given and pt voiced understanding.      06/14/2023  Name: Billy Patterson MRN: [id number] DOB: [birth year]  Today's TOC FU Call Status: Today's TOC FU Call Status:: Successful TOC FU Call Completed Unsuccessful Call (1st Attempt) Date: 06/09/23 Unsuccessful Call (2nd Attempt) Date: 06/12/23 [facility] FU Call Complete Date: 06/14/23 Patient's Name and Date of Birth confirmed.  Transition Care Management Follow-up Telephone Call Date of Discharge: 06/04/23 Discharge Facility: [facility]) Type of Discharge: Emergency Department Reason for ED Visit: Other: (seen [facility] ED 06/04/23 for rt groin pain and swollen rt testicle; pt had testing but ED could not tell pt what caused the pain except possible pull of groin.) How have you been since you were released from the hospital?: Worse Any questions or concerns?: No  Items Reviewed: Did you receive and understand the discharge instructions provided?: Yes Medications obtained,verified, and reconciled?: No (pt was not given med at ED) Any new allergies since your discharge?: No Dietary orders reviewed?: NA Do you have support at home?: Yes People in Home [RPT]: friend(s) Name of Support/Comfort Primary Source: [place] fiancee  Medications Reviewed Today: Medications Reviewed Today   Medications were not reviewed in this encounter     Home Care and Equipment/Supplies: Were Home Health Services Ordered?: NA Any new equipment or medical supplies ordered?: NA  Functional Questionnaire: Do you need assistance with bathing/showering or dressing?: No Do you need assistance with meal preparation?: No Do you  need assistance with eating?: No Do you have difficulty maintaining continence: No Do you need assistance with getting out of bed/getting out of a chair/moving?: No Do you have difficulty managing or taking your medications?: No  Follow up appointments reviewed: PCP Follow-up appointment confirmed?: NA Specialist Hospital Follow-up appointment confirmed?: NA Do you need transportation to your follow-up appointment?: No Do you understand care options if your condition(s) worsen?: Yes-patient verbalized understanding    SIGNATURE. Lewanda Rife, LPN

## 2023-06-14 NOTE — ED Provider Notes (Signed)
-----------------------------------------   12:03 AM on 06/14/2023 -----------------------------------------   US Renal:  1. No hydronephrosis.  2. The punctate intrarenal calculi on recent CT have no sonographic  correlate.   US Scrotum: 1. No testicular torsion or mass.  2. Equivocal increased vascularity of the right testicle, possible  orchitis.  3. Right hydrocele has increased in complexity from prior exam.   Updated patient and family member of [place] results. Will place on Cipro and Naprosyn to take as needed. Refer to urology for outpatient follow up. Strict return precautions given.  Both verbalized understanding and agree with plan of care.   Irean Hong, MD 06/14/23 [phone]

## 2023-06-14 NOTE — ED Provider Notes (Incomplete)
-----------------------------------------   12:03 AM on 06/14/2023 -----------------------------------------

## 2023-06-14 NOTE — Transitions of Care (Post Inpatient/ED Visit) (Unsigned)
 I spoke wth pt who was seen at [facility] ED on 06/04/23 and again on 06/13/23 due to worsening of pain and swelling in rt testicle. pt was eval and testing done pt given Abx and anti inflammatory med that has helped. Abx started on 06/13/23. Pt said he can walk now without pain and the rt testicle is not as painful. Pt has not cked for swelling of testicle today.Pt has FU appt with urologist but not until 08/01/23 due to pt s schedule and availability.pt will cb to [facility] if after finishing abx symptoms have not resolved. UC & ED precautions given and pt voiced understanding.Sending note to Dr Alphonsus Sias.        06/14/2023  Name: Billy Patterson MRN: [id number] DOB: Apr 20, 1985  Today's TOC FU Call Status: Today's TOC FU Call Status:: Successful TOC FU Call Completed TOC FU Call Complete Date: 06/14/23 Patient's Name and Date of Birth confirmed.  Transition Care Management Follow-up Telephone Call Date of Discharge: 06/13/23 Discharge Facility: [facility]) Type of Discharge: Emergency Department Reason for ED Visit: Other: (I spoke wth pt who was seen at [facility] ED on 06/04/23 and again on 06/13/23 due to worsening of pain and swelling in rt testicle. pt was eval and testing doen pt given Abx and anti inflammatory med that has helped.) How have you been since you were released from the hospital?: Better Any questions or concerns?: No  Items Reviewed: Did you receive and understand the discharge instructions provided?: Yes Medications obtained,verified, and reconciled?: Partial Review Completed Reason for Partial Mediation Review: pt is aware how to take abx and anti inflammatory med given at ED Any new allergies since your discharge?: No Dietary orders reviewed?: NA Do you have support at home?: Yes People in Home [RPT]: significant other Name of Support/Comfort Primary Source: savannah fiancee  Medications Reviewed Today: Medications Reviewed Today   Medications were not  reviewed in this encounter     Home Care and Equipment/Supplies: Were Home Health Services Ordered?: NA Any new equipment or medical supplies ordered?: NA  Functional Questionnaire: Do you need assistance with bathing/showering or dressing?: No Do you need assistance with meal preparation?: No Do you need assistance with eating?: No Do you have difficulty maintaining continence: No Do you need assistance with getting out of bed/getting out of a chair/moving?: No Do you have difficulty managing or taking your medications?: No  Follow up appointments reviewed: PCP Follow-up appointment confirmed?: NA (pt will cb for appt iwth PCP if when finishes abx pain and swelling is not resolved.) Specialist Hospital Follow-up appointment confirmed?: Yes Date of Specialist follow-up appointment?: 08/01/23 Follow-Up Specialty Provider:: urologist Do you need transportation to your follow-up appointment?: No Do you understand care options if your condition(s) worsen?: Yes-patient verbalized understanding    SIGNATURE Lewanda Rife, LPN

## 2023-06-14 NOTE — Telephone Encounter (Signed)
 Seen in ER Has worsening right hydrocele Not sure of disposition---but if ongoing symptoms, will likely need to go to urology

## 2023-06-15 NOTE — Telephone Encounter (Signed)
 See other note

## 2023-06-15 NOTE — Telephone Encounter (Signed)
 Noted.

## 2023-06-15 NOTE — Telephone Encounter (Signed)
 Spoke to pt. He said he start started the Cipro and the naproxen. I offered him an appt today. He would like to give it a few days to see if its going to work before coming in.

## 2023-08-01 ENCOUNTER — Ambulatory Visit: Admitting: Urology

## 2023-08-01 VITALS — BP 137/89 | HR 97 | Ht 70.0 in | Wt 205.1 lb

## 2023-08-01 DIAGNOSIS — N50811 Right testicular pain: Secondary | ICD-10-CM
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
## 2023-10-17 ENCOUNTER — Telehealth: Admitting: Nurse Practitioner

## 2023-10-17 ENCOUNTER — Telehealth: Admitting: Physician Assistant

## 2023-10-17 DIAGNOSIS — H65191 Other acute nonsuppurative otitis media, right ear: Secondary | ICD-10-CM

## 2023-10-17 DIAGNOSIS — H938X9 Other specified disorders of ear, unspecified ear: Secondary | ICD-10-CM

## 2023-10-17 MED ORDER — IPRATROPIUM BROMIDE 0.03 % NA SOLN
2.0000 | Freq: Two times a day (BID) | NASAL | 0 refills | Status: DC
Start: 2023-10-17 — End: 2024-01-30

## 2023-10-17 MED ORDER — AMOXICILLIN 875 MG PO TABS: 875.0000 mg | ORAL_TABLET | Freq: Two times a day (BID) | ORAL | 0 refills | Status: AC

## 2023-10-17 NOTE — Progress Notes (Signed)
 I have spent 5 minutes in review of e-visit questionnaire, review and updating patient chart, medical decision making and response to patient.   Elsie Velma Lunger, PA-C

## 2023-10-17 NOTE — Progress Notes (Signed)
 E-Visit for Ear Pain - Acute Otitis Media   We are sorry that you are not feeling well. Here is how we plan to help!  Based on what you have shared with me it looks like you have Acute Otitis Media.  Acute Otitis Media is an infection of the middle or inner ear. This type of infection can cause redness, inflammation, and fluid buildup behind the tympanic membrane (ear drum).  The usual symptoms include: Earache/Pain Fever Upper respiratory symptoms Lack of energy/Fatigue/Malaise Slight hearing loss gradually worsening- if the inner ear fills with fluid What causes middle ear infections? Most middle ear infections occur when an infection such as a cold, leads to a build-up of mucus in the middle ear and causes the Eustachian tube (a thin tube that runs from the middle ear to the back of the nose) to become swollen or blocked.   This means mucus can't drain away properly, making it easier for an infection to spread into the middle ear.  How middle ear infections are treated: Most ear infections clear up within three to five days and don't need any specific treatment. If necessary, tylenol  or ibuprofen should be used to relieve pain and a high temperature.  If you develop a fever higher than 102, or any significantly worsening symptoms, this could indicate a more serious infection moving to the middle/inner and needs face to face evaluation in an office by a provider.   Antibiotics aren't routinely used to treat middle ear infections, although they may occasionally be prescribed if symptoms persist or are particularly severe. Given your presentation,   I have prescribed Amoxicillin  875 mg one tablet twice daily for 10 days    Your symptoms should improve over the next 3 days and should resolve in about 7 days. Be sure to complete ALL of the prescription(s) given.  HOME CARE: Wash your hands frequently. If you are prescribed an ear drop, do not place the tip of the bottle on your ear or  touch it with your fingers. You can take Acetaminophen  650 mg every 4-6 hours as needed for pain.  If pain is severe or moderate, you can apply a heating pad (set on low) or hot water  bottle (wrapped in a towel) to outer ear for 20 minutes.  This will also increase drainage.  GET HELP RIGHT AWAY IF: Fever is over 102.2 degrees. You develop progressive ear pain or hearing loss. Ear symptoms persist longer than 3 days after treatment.  MAKE SURE YOU: Understand these instructions. Will watch your condition. Will get help right away if you are not doing well or get worse.  Thank you for choosing an e-visit.  Your e-visit answers were reviewed by a board certified advanced clinical practitioner to complete your personal care plan. Depending upon the condition, your plan could have included both over the counter or prescription medications.  Please review your pharmacy choice. Make sure the pharmacy is open so you can pick up the prescription now. If there is a problem, you may contact your provider through [employer] and have the prescription routed to another pharmacy.  Your safety is important to us . If you have drug allergies check your prescription carefully.   For the next 24 hours you can use MyChart to ask questions about today's visit, request a non-urgent call back, or ask for a work or school excuse. You will get an email with a survey after your eVisit asking about your experience. We would appreciate your feedback. I  hope that your e-visit has been valuable and will aid in your recovery.  I spent approximately 5 minutes reviewing the patient's history, current symptoms and coordinating their care today.

## 2023-10-17 NOTE — Progress Notes (Signed)

## 2024-01-15 ENCOUNTER — Ambulatory Visit

## 2024-01-15 DIAGNOSIS — Z113 Encounter for screening for infections with a predominantly sexual mode of transmission: Secondary | ICD-10-CM

## 2024-01-15 NOTE — Progress Notes (Signed)
 [facility] Department STI clinic [address], [address] [place] [address] Main phone: [phone]  STI screening visit  Subjective:  Quamir ABELINO TIPPIN is a 38 y.o. male being seen today for an STI screening visit. The patient reports they do not have symptoms.    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 No known contacts.  See flowsheet for further details and programmatic requirements  Hyperlink available at the top of the signed note in blue.  Flow sheet content below:  Pregnancy Intention Screening Does the patient want to become pregnant in the next year?: N/A Does the patient's partner want to become pregnant in the next year?: No Would the patient like to discuss contraceptive options today?: N/A Reason For STD Screen STD Screening: Is asymptomatic Have you ever had an STD?: No History of Antibiotic use in the past 2 weeks?: No STD Symptoms Denies all: Yes Risk Factors for Hep B Household, sexual, or needle sharing contact of a person infected with Hep B: No Sexual contact with a person who uses drugs not as prescribed?: No Currently or Ever used drugs not as prescribed: No HIV Positive: No PRep Patient: No Men who have sex with men: No Have Hepatitis C: No History of Incarceration: No History of Homeslessness?: No Anal sex following anal  drug use?: No Risk Factors for Hep C Currently using drugs not as prescribed: No Sexual partner(s) currently using drugs as not prescribed: No History of drug use: No HIV Positive: No People with a history of incarceration: No People born between the years of 35 and 50: No Advise Advised client to quit or stay quit. : No Counseling Patient counseled to use condoms with all sex: Condoms declined RTC in 2-3 weeks for test results: Yes Clinic will call if test results abnormal before test result appt.: Yes Test results given to patient Patient counseled to use condoms with all sex: Condoms declined  Screening for MPX risk:  Unexplained rash?  No   MSM?  No   Multiple or anonymous sex partners?  No   Any close or sexual contact with a person  diagnosed with MPX?  No   Any outside the [place]  where MPX is endemic?  No   High clinical suspicion for MPX?    -Unlikely to be chickenpox    -Lymphadenopathy    -Rash that presents in same phase of       evolution on any given body part  No   STI screening history: Last HIV test per patient/review of record was No results found for: HMHIVSCREEN  Lab Results  Component Value Date   HIV NON REAC 11/30/2007    Last HEPC test per patient/review of record was No results found for: HMHEPCSCREEN No components found for: HEPC   Last HEPB test per patient/review of record was No components found for: HMHEPBSCREEN   Fertility:  Does the patient or their partner desires a pregnancy in the next year? No  Immunization History  Administered Date(s) Administered   Influenza Whole 11/30/2007   Td 09/09/2016   Tdap 12/29/2006    The following portions of the patient's history were reviewed and updated as appropriate: allergies, current medications, past medical history, past social history, past surgical history and problem list.  Objective:  There were no vitals filed for this visit.  Physical Exam Constitutional:      Appearance:  Normal appearance.  HENT:     Head: Normocephalic.     Mouth/Throat:     Lips: Pink. No lesions.     Mouth: Mucous membranes are moist.     Pharynx: Oropharynx is clear. No pharyngeal swelling or oropharyngeal exudate.     Tonsils: No tonsillar exudate.  Eyes:     General: No scleral icterus.       Right eye: No discharge.        Left eye: No discharge.  Pulmonary:     Effort: Pulmonary effort is normal.  Abdominal:     General: Abdomen is flat.     Palpations: Abdomen is soft.  Lymphadenopathy:     Head:     Right side of head: No submental, submandibular, tonsillar, preauricular or posterior auricular adenopathy.     Left side of head: No submental, submandibular, tonsillar, preauricular or posterior auricular adenopathy.     Cervical: No cervical adenopathy.     Right cervical: No superficial or posterior cervical adenopathy.    Left cervical: No superficial or posterior cervical adenopathy.  Skin:    General: Skin is warm and dry.     Findings: No rash.  Neurological:     General: No focal deficit present.     Mental Status: He is alert.  Psychiatric:        Mood and Affect: Mood normal.        Behavior: Behavior normal.    Assessment and Plan:  Tremond LAVONTAY KIRK is a 38 y.o. male presenting to the [facility] Department for STI screening  1. Screening for venereal disease (Primary)  - HIV/HCV [facility] - HBV Antigen/Antibody [facility] - Syphilis Serology, [place] [facility] - Chlamydia/GC NAA, Confirmation  Patient does not have STI symptoms Patient accepted the following screenings: urine CT/GC, HIV, RPR, Hep B, and Hep C Patient meets criteria for HepB screening? Yes. Ordered? yes Patient meets criteria for HepC screening? Yes. Ordered? yes Recommended condom use with all sex Discussed importance of condom use for STI prevention  Treat positive test results per standing order. Discussed time line for [facility] results and that patient will be called  with positive results and encouraged patient to call if he had not heard in 2 weeks Recommended repeat testing in 3 months with positive results. Recommended returning for continued or worsening symptoms.   Return for STI testing as needed.  No future appointments.  Damien FORBES Satchel, NP

## 2024-01-15 NOTE — Progress Notes (Signed)
 Pt here for STI screening.  No in house labs performed today.  Condoms declined.-Collins Scotland, RN

## 2024-01-17 LAB — CHLAMYDIA/GC NAA, CONFIRMATION
Chlamydia trachomatis, NAA: NEGATIVE
Neisseria gonorrhoeae, NAA: NEGATIVE

## 2024-01-18 LAB — HBV ANTIGEN/ANTIBODY STATE LAB
Hep B Core Total Ab: NONREACTIVE
Hep B S Ab: NONREACTIVE
Hepatitis B Surface Ag: NONREACTIVE

## 2024-01-30 ENCOUNTER — Telehealth: Admitting: Physician Assistant

## 2024-01-30 DIAGNOSIS — J208 Acute bronchitis due to other specified organisms: Secondary | ICD-10-CM

## 2024-01-30 MED ORDER — BENZONATATE 100 MG PO CAPS: 100.0000 mg | ORAL_CAPSULE | Freq: Three times a day (TID) | ORAL | 0 refills | Status: AC | PRN

## 2024-01-30 MED ORDER — PREDNISONE 10 MG (21) PO TBPK: ORAL_TABLET | ORAL | 0 refills | Status: AC

## 2024-01-30 NOTE — Progress Notes (Signed)
 Message sent to patient requesting further input regarding current symptoms. Awaiting patient response.

## 2024-01-30 NOTE — Progress Notes (Signed)
 We are sorry that you are not feeling well.  Here is how we plan to help!  Based on your presentation I believe you most likely have A cough due to a virus.  This is called viral bronchitis and is best treated by rest, plenty of fluids and control of the cough.  You may use Ibuprofen or Tylenol  as directed to help your symptoms.     In addition you may use A non-prescription cough medication called Mucinex DM: take 2 tablets every 12 hours. and A prescription cough medication called Tessalon  Perles 100mg . You may take 1-2 capsules every 8 hours as needed for your cough.  Prednisone  10 mg daily for 6 days (see taper instructions below)  Directions for 6 day taper: Day 1: 2 tablets before breakfast, 1 after both lunch & dinner and 2 at bedtime Day 2: 1 tab before breakfast, 1 after both lunch & dinner and 2 at bedtime Day 3: 1 tab at each meal & 1 at bedtime Day 4: 1 tab at breakfast, 1 at lunch, 1 at bedtime Day 5: 1 tab at breakfast & 1 tab at bedtime Day 6: 1 tab at breakfast  From your responses in the eVisit questionnaire you describe inflammation in the upper respiratory tract which is causing a significant cough.  This is commonly called Bronchitis and has four common causes:   Allergies Viral Infections Acid Reflux Bacterial Infection Allergies, viruses and acid reflux are treated by controlling symptoms or eliminating the cause. An example might be a cough caused by taking certain blood pressure medications. You stop the cough by changing the medication. Another example might be a cough caused by acid reflux. Controlling the reflux helps control the cough.  USE OF BRONCHODILATOR (RESCUE) INHALERS: There is a risk from using your bronchodilator too frequently.  The risk is that over-reliance on a medication which only relaxes the muscles surrounding the breathing tubes can reduce the effectiveness of medications prescribed to reduce swelling and congestion of the tubes themselves.   Although you feel brief relief from the bronchodilator inhaler, your asthma may actually be worsening with the tubes becoming more swollen and filled with mucus.  This can delay other crucial treatments, such as oral steroid medications. If you need to use a bronchodilator inhaler daily, several times per day, you should discuss this with your provider.  There are probably better treatments that could be used to keep your asthma under control.     HOME CARE Only take medications as instructed by your medical team. Complete the entire course of an antibiotic. Drink plenty of fluids and get plenty of rest. Avoid close contacts especially the very young and the elderly Cover your mouth if you cough or cough into your sleeve. Always remember to wash your hands A steam or ultrasonic humidifier can help congestion.   GET HELP RIGHT AWAY IF: You develop worsening fever. You become short of breath You cough up blood. Your symptoms persist after you have completed your treatment plan MAKE SURE YOU  Understand these instructions. Will watch your condition. Will get help right away if you are not doing well or get worse.  Your e-visit answers were reviewed by a board certified advanced clinical practitioner to complete your personal care plan.  Depending on the condition, your plan could have included both over the counter or prescription medications. If there is a problem please reply  once you have received a response from your provider. Your safety is important to us .  If you have drug allergies check your prescription carefully.    You can use MyChart to ask questions about today's visit, request a non-urgent call back, or ask for a work or school excuse for 24 hours related to this e-Visit. If it has been greater than 24 hours you will need to follow up with your provider, or enter a new e-Visit to address those concerns. You will get an e-mail in the next two days asking about your experience.   I hope that your e-visit has been valuable and will speed your recovery. Thank you for using e-visits.   I have spent 5 minutes in review of e-visit questionnaire, review and updating patient chart, medical decision making and response to patient.   Delon CHRISTELLA Dickinson, PA-C
